# Patient Record
Sex: Female | Born: 1935 | Race: White | Hispanic: No | Smoking: Never smoker
Health system: Southern US, Community
[De-identification: ages and names within clinical notes are randomized; demographics above are authoritative.]

## PROBLEM LIST (undated history)

## (undated) DIAGNOSIS — R413 Other amnesia: Secondary | ICD-10-CM

## (undated) DIAGNOSIS — F419 Anxiety disorder, unspecified: Secondary | ICD-10-CM

## (undated) DIAGNOSIS — H409 Unspecified glaucoma: Secondary | ICD-10-CM

## (undated) DIAGNOSIS — E860 Dehydration: Secondary | ICD-10-CM

## (undated) DIAGNOSIS — I10 Essential (primary) hypertension: Secondary | ICD-10-CM

## (undated) DIAGNOSIS — R569 Unspecified convulsions: Secondary | ICD-10-CM

## (undated) DIAGNOSIS — I2510 Atherosclerotic heart disease of native coronary artery without angina pectoris: Secondary | ICD-10-CM

## (undated) DIAGNOSIS — H3530 Unspecified macular degeneration: Secondary | ICD-10-CM

---

## 2004-10-09 ENCOUNTER — Emergency Department: Payer: Self-pay | Admitting: Unknown Physician Specialty

## 2004-10-11 ENCOUNTER — Inpatient Hospital Stay: Payer: Self-pay | Admitting: Anesthesiology

## 2005-09-13 ENCOUNTER — Ambulatory Visit: Payer: Self-pay | Admitting: Internal Medicine

## 2005-09-21 ENCOUNTER — Other Ambulatory Visit: Payer: Self-pay

## 2005-09-21 ENCOUNTER — Emergency Department: Payer: Self-pay | Admitting: Emergency Medicine

## 2005-09-22 ENCOUNTER — Other Ambulatory Visit: Payer: Self-pay

## 2005-09-22 ENCOUNTER — Inpatient Hospital Stay: Payer: Self-pay

## 2005-09-23 ENCOUNTER — Other Ambulatory Visit: Payer: Self-pay

## 2005-09-29 ENCOUNTER — Other Ambulatory Visit: Payer: Self-pay

## 2005-09-29 ENCOUNTER — Inpatient Hospital Stay: Payer: Self-pay

## 2005-09-30 ENCOUNTER — Other Ambulatory Visit: Payer: Self-pay

## 2005-10-01 ENCOUNTER — Other Ambulatory Visit: Payer: Self-pay

## 2013-12-23 ENCOUNTER — Emergency Department: Payer: Self-pay | Admitting: Internal Medicine

## 2014-03-18 ENCOUNTER — Inpatient Hospital Stay: Payer: Self-pay | Admitting: Internal Medicine

## 2014-03-18 LAB — COMPREHENSIVE METABOLIC PANEL
Albumin: 3.6 g/dL (ref 3.4–5.0)
Alkaline Phosphatase: 84 U/L
Anion Gap: 6 — ABNORMAL LOW (ref 7–16)
BILIRUBIN TOTAL: 0.7 mg/dL (ref 0.2–1.0)
BUN: 28 mg/dL — ABNORMAL HIGH (ref 7–18)
CHLORIDE: 111 mmol/L — AB (ref 98–107)
Calcium, Total: 9 mg/dL (ref 8.5–10.1)
Co2: 25 mmol/L (ref 21–32)
Creatinine: 2.04 mg/dL — ABNORMAL HIGH (ref 0.60–1.30)
EGFR (African American): 24 — ABNORMAL LOW
EGFR (Non-African Amer.): 21 — ABNORMAL LOW
Glucose: 102 mg/dL — ABNORMAL HIGH (ref 65–99)
Osmolality: 289 (ref 275–301)
Potassium: 4.1 mmol/L (ref 3.5–5.1)
SGOT(AST): 30 U/L (ref 15–37)
SGPT (ALT): 16 U/L (ref 12–78)
Sodium: 142 mmol/L (ref 136–145)
Total Protein: 7.1 g/dL (ref 6.4–8.2)

## 2014-03-18 LAB — CBC
HCT: 43.4 % (ref 35.0–47.0)
HGB: 14 g/dL (ref 12.0–16.0)
MCH: 30.4 pg (ref 26.0–34.0)
MCHC: 32.1 g/dL (ref 32.0–36.0)
MCV: 95 fL (ref 80–100)
Platelet: 123 10*3/uL — ABNORMAL LOW (ref 150–440)
RBC: 4.59 10*6/uL (ref 3.80–5.20)
RDW: 14.2 % (ref 11.5–14.5)
WBC: 9.4 10*3/uL (ref 3.6–11.0)

## 2014-03-18 LAB — URINALYSIS, COMPLETE
BACTERIA: NONE SEEN
BLOOD: NEGATIVE
Bilirubin,UR: NEGATIVE
GLUCOSE, UR: NEGATIVE mg/dL (ref 0–75)
Ketone: NEGATIVE
LEUKOCYTE ESTERASE: NEGATIVE
Nitrite: NEGATIVE
Ph: 5 (ref 4.5–8.0)
Protein: NEGATIVE
RBC,UR: <1 /HPF (ref 0–5)
SPECIFIC GRAVITY: 1.015 (ref 1.003–1.030)
Squamous Epithelial: NONE SEEN

## 2014-03-18 LAB — CK-MB: CK-MB: 1.7 ng/mL (ref 0.5–3.6)

## 2014-03-18 LAB — TROPONIN I: TROPONIN-I: 0.02 ng/mL

## 2014-03-19 LAB — BASIC METABOLIC PANEL
Anion Gap: 10 (ref 7–16)
BUN: 25 mg/dL — AB (ref 7–18)
CALCIUM: 8.9 mg/dL (ref 8.5–10.1)
CHLORIDE: 109 mmol/L — AB (ref 98–107)
CREATININE: 1.64 mg/dL — AB (ref 0.60–1.30)
Co2: 23 mmol/L (ref 21–32)
EGFR (Non-African Amer.): 27 — ABNORMAL LOW
GFR CALC AF AMER: 31 — AB
Glucose: 162 mg/dL — ABNORMAL HIGH (ref 65–99)
Osmolality: 291 (ref 275–301)
Potassium: 4.8 mmol/L (ref 3.5–5.1)
Sodium: 142 mmol/L (ref 136–145)

## 2014-03-19 LAB — CBC WITH DIFFERENTIAL/PLATELET
BASOS PCT: 0.1 %
Basophil #: 0 10*3/uL (ref 0.0–0.1)
EOS PCT: 0.2 %
Eosinophil #: 0 10*3/uL (ref 0.0–0.7)
HCT: 42.2 % (ref 35.0–47.0)
HGB: 14 g/dL (ref 12.0–16.0)
LYMPHS ABS: 0.9 10*3/uL — AB (ref 1.0–3.6)
LYMPHS PCT: 6.9 %
MCH: 31.1 pg (ref 26.0–34.0)
MCHC: 33.2 g/dL (ref 32.0–36.0)
MCV: 94 fL (ref 80–100)
MONO ABS: 1 x10 3/mm — AB (ref 0.2–0.9)
Monocyte %: 7.9 %
NEUTROS PCT: 84.9 %
Neutrophil #: 11 10*3/uL — ABNORMAL HIGH (ref 1.4–6.5)
Platelet: 134 10*3/uL — ABNORMAL LOW (ref 150–440)
RBC: 4.5 10*6/uL (ref 3.80–5.20)
RDW: 14 % (ref 11.5–14.5)
WBC: 13 10*3/uL — ABNORMAL HIGH (ref 3.6–11.0)

## 2014-03-19 LAB — TROPONIN I
TROPONIN-I: 0.04 ng/mL
TROPONIN-I: 0.19 ng/mL — AB
Troponin-I: 0.14 ng/mL — ABNORMAL HIGH

## 2014-03-19 LAB — CK-MB
CK-MB: 4.6 ng/mL — ABNORMAL HIGH (ref 0.5–3.6)
CK-MB: 6.6 ng/mL — AB (ref 0.5–3.6)

## 2014-03-20 ENCOUNTER — Ambulatory Visit: Payer: Self-pay | Admitting: Neurology

## 2014-03-20 LAB — CBC WITH DIFFERENTIAL/PLATELET
BASOS PCT: 0.4 %
Basophil #: 0 10*3/uL (ref 0.0–0.1)
EOS PCT: 6.3 %
Eosinophil #: 0.4 10*3/uL (ref 0.0–0.7)
HCT: 38.6 % (ref 35.0–47.0)
HGB: 13 g/dL (ref 12.0–16.0)
LYMPHS ABS: 1 10*3/uL (ref 1.0–3.6)
Lymphocyte %: 16.2 %
MCH: 31.4 pg (ref 26.0–34.0)
MCHC: 33.6 g/dL (ref 32.0–36.0)
MCV: 93 fL (ref 80–100)
MONOS PCT: 16.5 %
Monocyte #: 1 x10 3/mm — ABNORMAL HIGH (ref 0.2–0.9)
Neutrophil #: 3.8 10*3/uL (ref 1.4–6.5)
Neutrophil %: 60.6 %
Platelet: 112 10*3/uL — ABNORMAL LOW (ref 150–440)
RBC: 4.13 10*6/uL (ref 3.80–5.20)
RDW: 14.1 % (ref 11.5–14.5)
WBC: 6.3 10*3/uL (ref 3.6–11.0)

## 2014-03-20 LAB — PROTIME-INR
INR: 1.2
Prothrombin Time: 14.9 secs — ABNORMAL HIGH (ref 11.5–14.7)

## 2014-03-20 LAB — BASIC METABOLIC PANEL
Anion Gap: 6 — ABNORMAL LOW (ref 7–16)
BUN: 16 mg/dL (ref 7–18)
CHLORIDE: 114 mmol/L — AB (ref 98–107)
CREATININE: 1.27 mg/dL (ref 0.60–1.30)
Calcium, Total: 8.5 mg/dL (ref 8.5–10.1)
Co2: 23 mmol/L (ref 21–32)
EGFR (African American): 42 — ABNORMAL LOW
EGFR (Non-African Amer.): 37 — ABNORMAL LOW
GLUCOSE: 96 mg/dL (ref 65–99)
Osmolality: 286 (ref 275–301)
Potassium: 3.7 mmol/L (ref 3.5–5.1)
Sodium: 143 mmol/L (ref 136–145)

## 2014-03-20 LAB — APTT: Activated PTT: 53.4 secs — ABNORMAL HIGH (ref 23.6–35.9)

## 2014-03-21 LAB — CSF CELL CT + PROT + GLU PANEL
CSF Tube #: 3
EOS PCT: 0 %
GLUCOSE, CSF: 57 mg/dL (ref 40–75)
Lymphocytes: 0 %
Monocytes/Macrophages: 0 %
NEUTROS PCT: 0 %
OTHER CELLS: 0 %
Protein, CSF: 42 mg/dL (ref 15–45)
RBC (CSF): 0 /mm3
WBC (CSF): 0 /mm3

## 2014-03-21 LAB — PROTIME-INR
INR: 1.1
PROTHROMBIN TIME: 14.3 s (ref 11.5–14.7)

## 2014-03-21 LAB — APTT: Activated PTT: 30.7 secs (ref 23.6–35.9)

## 2014-03-24 LAB — CSF CULTURE

## 2014-04-05 ENCOUNTER — Observation Stay: Payer: Self-pay | Admitting: Internal Medicine

## 2014-04-05 LAB — APTT: Activated PTT: 29.2 secs (ref 23.6–35.9)

## 2014-04-05 LAB — CK-MB
CK-MB: 1.6 ng/mL (ref 0.5–3.6)
CK-MB: 1.6 ng/mL (ref 0.5–3.6)

## 2014-04-05 LAB — CBC
HCT: 42.2 % (ref 35.0–47.0)
HGB: 14.1 g/dL (ref 12.0–16.0)
MCH: 31.3 pg (ref 26.0–34.0)
MCHC: 33.5 g/dL (ref 32.0–36.0)
MCV: 94 fL (ref 80–100)
Platelet: 171 10*3/uL (ref 150–440)
RBC: 4.51 10*6/uL (ref 3.80–5.20)
RDW: 14.5 % (ref 11.5–14.5)
WBC: 6.5 10*3/uL (ref 3.6–11.0)

## 2014-04-05 LAB — COMPREHENSIVE METABOLIC PANEL
ALBUMIN: 3.6 g/dL (ref 3.4–5.0)
AST: 32 U/L (ref 15–37)
Alkaline Phosphatase: 91 U/L
Anion Gap: 5 — ABNORMAL LOW (ref 7–16)
BUN: 21 mg/dL — AB (ref 7–18)
Bilirubin,Total: 0.7 mg/dL (ref 0.2–1.0)
CALCIUM: 9.4 mg/dL (ref 8.5–10.1)
CHLORIDE: 107 mmol/L (ref 98–107)
Co2: 29 mmol/L (ref 21–32)
Creatinine: 1.57 mg/dL — ABNORMAL HIGH (ref 0.60–1.30)
EGFR (Non-African Amer.): 28 — ABNORMAL LOW
GFR CALC AF AMER: 33 — AB
Glucose: 111 mg/dL — ABNORMAL HIGH (ref 65–99)
Osmolality: 285 (ref 275–301)
Potassium: 4.5 mmol/L (ref 3.5–5.1)
SGPT (ALT): 23 U/L (ref 12–78)
Sodium: 141 mmol/L (ref 136–145)
Total Protein: 7.5 g/dL (ref 6.4–8.2)

## 2014-04-05 LAB — URINALYSIS, COMPLETE
BLOOD: NEGATIVE
Bacteria: NONE SEEN
Bilirubin,UR: NEGATIVE
Glucose,UR: NEGATIVE mg/dL (ref 0–75)
Hyaline Cast: 9
KETONE: NEGATIVE
Leukocyte Esterase: NEGATIVE
Nitrite: NEGATIVE
Ph: 6 (ref 4.5–8.0)
Protein: NEGATIVE
RBC,UR: 1 /HPF (ref 0–5)
SPECIFIC GRAVITY: 1.017 (ref 1.003–1.030)
Squamous Epithelial: <1
WBC UR: 3 /HPF (ref 0–5)

## 2014-04-05 LAB — TROPONIN I
TROPONIN-I: 0.03 ng/mL
TROPONIN-I: 0.04 ng/mL
Troponin-I: 0.03 ng/mL

## 2014-04-05 LAB — PROTIME-INR
INR: 1
PROTHROMBIN TIME: 13.2 s (ref 11.5–14.7)

## 2014-04-06 LAB — CBC WITH DIFFERENTIAL/PLATELET
BASOS ABS: 0 10*3/uL (ref 0.0–0.1)
Basophil %: 0.9 %
Eosinophil #: 0.3 10*3/uL (ref 0.0–0.7)
Eosinophil %: 6 %
HCT: 38.2 % (ref 35.0–47.0)
HGB: 12.8 g/dL (ref 12.0–16.0)
LYMPHS ABS: 1 10*3/uL (ref 1.0–3.6)
LYMPHS PCT: 21.6 %
MCH: 31.5 pg (ref 26.0–34.0)
MCHC: 33.6 g/dL (ref 32.0–36.0)
MCV: 94 fL (ref 80–100)
Monocyte #: 0.8 x10 3/mm (ref 0.2–0.9)
Monocyte %: 17.2 %
Neutrophil #: 2.6 10*3/uL (ref 1.4–6.5)
Neutrophil %: 54.3 %
Platelet: 146 10*3/uL — ABNORMAL LOW (ref 150–440)
RBC: 4.07 10*6/uL (ref 3.80–5.20)
RDW: 14.2 % (ref 11.5–14.5)
WBC: 4.7 10*3/uL (ref 3.6–11.0)

## 2014-04-06 LAB — BASIC METABOLIC PANEL
ANION GAP: 10 (ref 7–16)
BUN: 18 mg/dL (ref 7–18)
CALCIUM: 8.9 mg/dL (ref 8.5–10.1)
CHLORIDE: 110 mmol/L — AB (ref 98–107)
CREATININE: 1.3 mg/dL (ref 0.60–1.30)
Co2: 24 mmol/L (ref 21–32)
EGFR (African American): 41 — ABNORMAL LOW
GFR CALC NON AF AMER: 36 — AB
Glucose: 89 mg/dL (ref 65–99)
OSMOLALITY: 288 (ref 275–301)
POTASSIUM: 4.2 mmol/L (ref 3.5–5.1)
Sodium: 144 mmol/L (ref 136–145)

## 2014-04-06 LAB — MAGNESIUM: MAGNESIUM: 1.8 mg/dL

## 2014-04-25 ENCOUNTER — Ambulatory Visit: Payer: Self-pay | Admitting: Internal Medicine

## 2014-09-26 ENCOUNTER — Observation Stay: Payer: Self-pay | Admitting: Internal Medicine

## 2014-09-26 LAB — COMPREHENSIVE METABOLIC PANEL
ALBUMIN: 3.2 g/dL — AB (ref 3.4–5.0)
Alkaline Phosphatase: 88 U/L
Anion Gap: 7 (ref 7–16)
BUN: 29 mg/dL — AB (ref 7–18)
Bilirubin,Total: 0.6 mg/dL (ref 0.2–1.0)
CALCIUM: 8.5 mg/dL (ref 8.5–10.1)
CHLORIDE: 112 mmol/L — AB (ref 98–107)
CO2: 23 mmol/L (ref 21–32)
Creatinine: 1.36 mg/dL — ABNORMAL HIGH (ref 0.60–1.30)
GFR CALC AF AMER: 47 — AB
GFR CALC NON AF AMER: 39 — AB
GLUCOSE: 121 mg/dL — AB (ref 65–99)
Osmolality: 290 (ref 275–301)
POTASSIUM: 4.5 mmol/L (ref 3.5–5.1)
SGOT(AST): 35 U/L (ref 15–37)
SGPT (ALT): 19 U/L
Sodium: 142 mmol/L (ref 136–145)
Total Protein: 6.8 g/dL (ref 6.4–8.2)

## 2014-09-26 LAB — PROTIME-INR
INR: 1
Prothrombin Time: 13.4 secs (ref 11.5–14.7)

## 2014-09-26 LAB — CBC
HCT: 39.3 % (ref 35.0–47.0)
HGB: 13 g/dL (ref 12.0–16.0)
MCH: 31.3 pg (ref 26.0–34.0)
MCHC: 33.1 g/dL (ref 32.0–36.0)
MCV: 95 fL (ref 80–100)
Platelet: 145 10*3/uL — ABNORMAL LOW (ref 150–440)
RBC: 4.16 10*6/uL (ref 3.80–5.20)
RDW: 13.8 % (ref 11.5–14.5)
WBC: 9.3 10*3/uL (ref 3.6–11.0)

## 2014-09-26 LAB — URINALYSIS, COMPLETE
BILIRUBIN, UR: NEGATIVE
Blood: NEGATIVE
Glucose,UR: NEGATIVE mg/dL (ref 0–75)
KETONE: NEGATIVE
Nitrite: NEGATIVE
PH: 5 (ref 4.5–8.0)
Protein: NEGATIVE
Specific Gravity: 1.015 (ref 1.003–1.030)
WBC UR: 13 /HPF (ref 0–5)

## 2014-09-26 LAB — CK TOTAL AND CKMB (NOT AT ARMC)
CK, Total: 48 U/L (ref 26–192)
CK-MB: 1.6 ng/mL (ref 0.5–3.6)

## 2014-09-26 LAB — APTT: Activated PTT: 29.6 secs (ref 23.6–35.9)

## 2014-09-26 LAB — TROPONIN I

## 2014-09-27 LAB — BASIC METABOLIC PANEL
Anion Gap: 4 — ABNORMAL LOW (ref 7–16)
BUN: 18 mg/dL (ref 7–18)
CALCIUM: 8.3 mg/dL — AB (ref 8.5–10.1)
CO2: 27 mmol/L (ref 21–32)
CREATININE: 1.28 mg/dL (ref 0.60–1.30)
Chloride: 112 mmol/L — ABNORMAL HIGH (ref 98–107)
EGFR (Non-African Amer.): 41 — ABNORMAL LOW
GFR CALC AF AMER: 50 — AB
GLUCOSE: 98 mg/dL (ref 65–99)
Osmolality: 287 (ref 275–301)
POTASSIUM: 3.7 mmol/L (ref 3.5–5.1)
Sodium: 143 mmol/L (ref 136–145)

## 2014-09-28 LAB — CBC WITH DIFFERENTIAL/PLATELET
BASOS PCT: 0.9 %
Basophil #: 0 10*3/uL (ref 0.0–0.1)
Eosinophil #: 0.3 10*3/uL (ref 0.0–0.7)
Eosinophil %: 7.9 %
HCT: 34.6 % — ABNORMAL LOW (ref 35.0–47.0)
HGB: 11.2 g/dL — ABNORMAL LOW (ref 12.0–16.0)
LYMPHS ABS: 1.3 10*3/uL (ref 1.0–3.6)
Lymphocyte %: 30.3 %
MCH: 31.2 pg (ref 26.0–34.0)
MCHC: 32.4 g/dL (ref 32.0–36.0)
MCV: 96 fL (ref 80–100)
Monocyte #: 0.7 x10 3/mm (ref 0.2–0.9)
Monocyte %: 16 %
Neutrophil #: 1.9 10*3/uL (ref 1.4–6.5)
Neutrophil %: 44.9 %
PLATELETS: 115 10*3/uL — AB (ref 150–440)
RBC: 3.59 10*6/uL — AB (ref 3.80–5.20)
RDW: 14.1 % (ref 11.5–14.5)
WBC: 4.2 10*3/uL (ref 3.6–11.0)

## 2014-09-28 LAB — BASIC METABOLIC PANEL
Anion Gap: 6 — ABNORMAL LOW (ref 7–16)
BUN: 17 mg/dL (ref 7–18)
Calcium, Total: 8.6 mg/dL (ref 8.5–10.1)
Chloride: 115 mmol/L — ABNORMAL HIGH (ref 98–107)
Co2: 24 mmol/L (ref 21–32)
Creatinine: 1.22 mg/dL (ref 0.60–1.30)
EGFR (African American): 53 — ABNORMAL LOW
EGFR (Non-African Amer.): 44 — ABNORMAL LOW
Glucose: 83 mg/dL (ref 65–99)
Osmolality: 289 (ref 275–301)
Potassium: 3.7 mmol/L (ref 3.5–5.1)
SODIUM: 145 mmol/L (ref 136–145)

## 2014-10-01 LAB — CULTURE, BLOOD (SINGLE)

## 2014-11-07 ENCOUNTER — Ambulatory Visit: Payer: Self-pay | Admitting: Family Medicine

## 2014-11-23 ENCOUNTER — Inpatient Hospital Stay: Payer: Self-pay | Admitting: Internal Medicine

## 2014-12-06 ENCOUNTER — Ambulatory Visit
Admit: 2014-12-06 | Disposition: A | Discharge status: Home / Self Care | Payer: Self-pay | Attending: Family Medicine | Admitting: Family Medicine

## 2014-12-21 ENCOUNTER — Emergency Department: Payer: Self-pay | Admitting: Emergency Medicine

## 2015-01-28 NOTE — H&P (Signed)
PATIENT NAME:  [name], [name] MR#:  [id number] DATE OF BIRTH:  03-23-1920  DATE OF ADMISSION:  09/26/2014  REFERRING PHYSICIAN:  Bobetta LimeEryka A. Inocencio HomesGayle, MD  PRIMARY CARE PHYSICIAN:  Jillene BucksDenny C. Arlana Pouchate, MD   CHIEF COMPLAINT:  Almost passed out.   HISTORY OF PRESENT ILLNESS:  This is a 81 year old Caucasian female with a history of chronic atrial fibrillation, hypertension essential, hyperlipidemia unspecified, coronary artery disease status post PCI and stent placement, presenting with near syncope. [facility] is from [facility]. They said she felt weak the majority of the day, uses a walker at baseline; however, today she says she was trying to walk with a walker and felt faint, almost passed out. She was assisted by some staff members to a seated position. She did not pass out. No loss of consciousness. Denies any preceding symptoms including chest pain, palpitations, or shortness of breath. She does attest to having some increased urinary frequency. Denies any dysuria. No further symptomatology. Of note, in the Emergency Department, she was given ceftriaxone and became more confused. Blood pressure dropped transiently; she received IV fluids and has subsequently resolved.   REVIEW OF SYSTEMS: CONSTITUTIONAL:  Positive for fatigue and weakness. Denies fevers or chills.  EYES:  Denies blurred vision, double vision, or eye pain.  EARS, NOSE, AND THROAT:  Denies tinnitus, ear pain, or hearing loss. RESPIRATORY:  Denies cough, wheeze, or shortness of breath.  CARDIOVASCULAR:  Denies chest pain, palpitations, or edema.  GASTROINTESTINAL:  Denies nausea, vomiting, diarrhea, or abdominal pain.  GENITOURINARY:  Positive for increased urinary frequency.  ENDOCRINE:  Denies nocturia or thyroid problems. HEMATOLOGIC AND LYMPHATIC:  Denies easy bruising or bleeding.   SKIN:  Denies rash or lesions.  MUSCULOSKELETAL:  Denies pain in neck, back, shoulder, knees, or hips or arthritic symptoms. NEUROLOGIC:   Denies  paralysis or paresthesias.  PSYCHIATRIC:  Denies anxiety or depressive symptoms.   Otherwise, a full review of systems performed by me is negative.  PAST MEDICAL HISTORY:  Anxiety, depression not otherwise specified, atrial fibrillation chronic, hypertension essential, hyperlipidemia unspecified, coronary artery disease status post PCI and stent placement.   SOCIAL HISTORY:  Resides at [facility]. Uses a walker for ambulation. No alcohol, tobacco, or drug use.   FAMILY HISTORY:  Positive for coronary artery disease.  ALLERGIES:  No known drug allergies.   HOME MEDICATIONS:  Aspirin 81 mg Miles.o. daily, buspirone 10 mg 1/2 tablet Miles.o. b.i.d., metoprolol 25 mg daily, Detrol 4 mg every other day, hydralazine 10 mg Miles.o. 4 times daily.   PHYSICAL EXAMINATION: VITAL SIGNS:  Temperature 97.3, heart rate 89, respirations 18, blood pressure 170/74, saturating 99% on room air.   GENERAL:  Weak-appearing Caucasian female, currently in no acute distress.  HEAD:  Normocephalic, atraumatic.  EYES:  Pupils are equal, round, and reactive to light intact. No extraocular muscles are intact. No scleral icterus.  MOUTH:  Moist mucosal membranes. Dentition is intact. No abscess noted.  EARS, NOSE, AND THROAT:  Clear without exudates. No external lesions.  NECK:  Supple. No thyromegaly. No nodules. No JVD.  PULMONARY:  Clear to auscultation bilaterally without wheezes, rales, or rhonchi. No use of accessory muscles. Good respiratory effort. CHEST:  Nontender to palpation.  CARDIOVASCULAR:  S1, S2. Irregular rate, irregular rhythm. No murmurs, rubs, or gallops. No edema. Pedal pulses are 2+ bilaterally.  GASTROINTESTINAL:  Soft, nontender, nondistended. No masses. Positive bowel sounds. No hepatosplenomegaly.  MUSCULOSKELETAL:  No swelling, clubbing, or edema. Range of motion is full  in all extremities.  NEUROLOGIC: Cranial nerves II through XII are intact. No gross neurologic deficit. Sensation is  intact. Reflexes are intact.  SKIN:  No ulcerations, lesions, rashes, or cyanosis. Skin is warm and dry. Turgor is intact.  PSYCHIATRIC:  Mood and affect are within normal limits. She is awake, alert, and oriented to person, place, and time, although her responses are somewhat slowed. She had difficulty answering what year it was, though eventually did get it correct. Insight and judgment appear to be intact at this time.   LABORATORY DATA:  Sodium is 142, potassium 4.5, chloride 112, bicarbonate 53, BUN 29, creatinine 1.36, glucose 121. LFTs:  Albumin of 3.2, otherwise within normal limits. Troponin is less than 0.02. WBC is 9.3, hemoglobin 13, platelets of 145,000. INR is 1. Urinalysis:  WBCs 13, RBCs 2, leukocyte esterase 2+, epithelial cells 1.   She had an echocardiogram performed back in July of this year:  EF of 60 to 65%. There is some diastolic dysfunction, however. The remainder of laboratory data:  Chest x-ray performed, which reveals underlying emphysema. There is a nodular opacity of the left apex measuring 3.1 x 2.3 cm. CT of the head performed reveals no acute intracranial process.   ASSESSMENT AND PLAN:  This is a 81 year old Caucasian female with history of chronic atrial fibrillation, essential hypertension, unspecified hyperlipidemia, presenting with a near syncopal episode.   1.  Near syncope. We will place on telemetry. Trend cardiac enzymes x 3. We will provide gentle IV fluid hydration.  2.  Urinary tract infection. Given ceftriaxone in the Emergency Department and became confused. Blood pressure dropped. We will follow culture data and continue antibiotic coverage with Cipro. Ceftriaxone has been listed as one of her allergies.  3.  Hypertension. Continue with Lopressor/hydralazine.  4.  Anxiety and depression not otherwise specified. Continue buspirone.  5.  Lung mass noted on chest x-ray. No prior mention of any lung mass on prior chest x-rays. It is measuring 3.1 x 2.3 cm on  chest x-ray in the left apex. We will get a noncontrast CT to further assess this.  6.  Venous thromboembolism prophylaxis with heparin subcutaneously.   CODE STATUS:  The patient is a full code.  TIME SPENT:  45 minutes.    ____________________________ Cletis Athens. Lister Brizzi, MD dkh:nb D: 09/27/2014 00:28:14 ET T: 09/27/2014 01:18:12 ET JOB#: [id number]  cc: Cletis Athens. Trevyon Swor, MD, <Dictator> Tora Prunty Synetta Shadow MD ELECTRONICALLY SIGNED 09/27/2014 3:14

## 2015-01-28 NOTE — Consult Note (Signed)
PATIENT NAME:  [name], [name] MR#:  [id number] DATE OF BIRTH:  June 16, 1920  CARDIOLOGY CONSULTATION   DATE OF CONSULTATION:  03/19/2014  CONSULTING PHYSICIAN:  Marcina MillardAlexander Kandon Hosking, MD  PRIMARY CARE PHYSICIAN: Jillene BucksDenny C. Arlana Pouchate, MD   CHIEF COMPLAINT: Altered mental status.   HISTORY OF PRESENT ILLNESS: The patient is a 81 year old female, currently a resident at an independent living facility, admitted with altered mental status. The patient has known coronary artery disease, status post prior coronary stent. The patient apparently was found naked by her couch, was experiencing altered mental status with un-interpretable speech. The patient was [facility] Room, where a head CT was performed which did not reveal any acute intracranial process. The patient was able to move all extremities, but was unable to converse or follow any commands. Admission labs were notable for elevated BUN and creatinine of 28 and 2.04, respectively. Troponin was borderline elevated to 0.19.   PAST MEDICAL HISTORY:  1. Coronary artery disease, status post coronary stent.  2. Atrial fibrillation.  3. Hypertension.  4. Hyperlipidemia.  5. Anxiety disorder.   MEDICATIONS:  1. Aspirin 81 mg daily. 2. Toprol-XL. 3. Buspirone 5 mg b.i.d. 4. Detrol LA 4 mg every other day.   SOCIAL HISTORY: The patient is currently a resident in assisted living.   FAMILY HISTORY: Mother is status post myocardial infarction.   REVIEW OF SYSTEMS: Not obtainable.   PHYSICAL EXAMINATION:  VITAL SIGNS: Blood pressure 151/81, pulse is 112, respirations 22, temperature 98.7, pulse oximetry 97%.  HEENT: Pupils were equally reactive to light and accommodation.  NECK: Supple without thyromegaly.  LUNGS: Clear.  CARDIOVASCULAR: Normal JVP. Normal PMI. Regular rate and rhythm. Normal S1, S2. No appreciable gallop, murmur or rub.  ABDOMEN: Soft and nontender. Pulses were intact bilaterally.  MUSCULOSKELETAL: Normal muscle tone.   NEUROLOGICAL: The patient was in obvious delirium, able to move all extremities, unable to converse or answer any questions.   IMPRESSION: A 81 year old female who presents with altered mental status, unable to converse, without specific complaints. EKG is nondiagnostic. Troponin is borderline elevated. I doubt this is due to acute coronary syndrome in light of the patient's presentation. The patient does have chronic kidney disease, but otherwise, labs were unremarkable.   RECOMMENDATIONS:  1. Agree with overall current therapy.  2. Would defer full-dose anticoagulation.  3. Consider 2-D echocardiogram, though the patient would likely not be cooperative at this point. 4. Await neurological evaluation.  5. Further recommendations pending the patient's initial clinical course.   ____________________________ Marcina MillardAlexander Khaliah Barnick, MD ap:lb D: 03/19/2014 09:45:13 ET T: 03/19/2014 10:08:14 ET JOB#: [id number]  cc: Marcina MillardAlexander Aala Ransom, MD, <Dictator> Marcina MillardALEXANDER Kannan Proia MD ELECTRONICALLY SIGNED 03/22/2014 8:33

## 2015-01-28 NOTE — H&P (Signed)
PATIENT NAME:  [name], [name] MR#:  [id number] DATE OF BIRTH:  [birth year]  DATE OF ADMISSION:  04/05/2014  REFERRING PHYSICIAN: Dr. Cyril Loosen.   PRIMARY CARE PHYSICIAN: Dr. Arlana Pouch.   CHIEF COMPLAINT: Confusion.   HISTORY OF PRESENT ILLNESS: The patient is a pleasant 81 year old female who is actually here on the 12th of June, got discharged after a few days after presenting with altered mental status, and she underwent extensive work-up including an MRI and LP. She did get some antibiotics, possibly for meningitis initially, but they were discontinued. [facility].  She is there, an independent living section. She was brought in after possible confusion episode, but at this point, she appears to be awake, alert, oriented x 3. Has no particular complaints. She has a CT of the head, which was negative for acute stroke. Hospitalist was contacted for further evaluation and management. The patient thinks that she might have passed out, but it is not clear.   PAST MEDICAL HISTORY: History of chronic atrial fibrillation, hypertension, hyperlipidemia, coronary artery disease, status post stent placement, and anxiety disorder.   ALLERGIES: NO KNOWN DRUG ALLERGIES.   OUTPATIENT MEDICATIONS: Toprol 25 mg b.i.d., Detrol LA 4 mg every other day, buspirone 10 mg 1/2 tablet 2 times a day, and aspirin 81 mg daily.   SOCIAL HISTORY: No tobacco, alcohol or drug use. Lives at [facility].   PAST SURGICAL HISTORY: Ovarian cyst removed and cardiac catheterization.   FAMILY HISTORY: Per chart, myocardial infarction in mom at old age. Brother had motor vehicle accident.  REVIEW OF SYSTEMS:  CONSTITUTIONAL: Denies fever and fatigue, maybe she is a little weak. She states no weight changes.  EYES: No blurry vision or double vision.  EAR, NOSE, THROAT: No tinnitus or hearing loss.  RESPIRATORY: No cough, wheezing, or shortness of breath.  CARDIOVASCULAR: Has a history of CAD and atrial fibrillation.   Denies palpitations or swelling in the legs.  GASTROINTESTINAL: Denies nausea, vomiting, bloody stools, diarrhea, constipation or  abdominal pain.  GENITOURINARY: Denies dysuria or hematuria. HEMATOLOGIC AND LYMPHATIC: Denies anemia or easy bruising.  SKIN: Denies rashes.  MUSCULOSKELETAL: Denies arthritis. NEUROLOGIC: Denies focal weakness or numbness.  PSYCHIATRIC: Has a little bit of anxiety.   PHYSICAL EXAMINATION: VITAL SIGNS: Temperature on arrival is 98.2, pulse rate 78, respiratory rate 17, blood pressure 141/72, oxygen saturation 98% on room air.  GENERAL: The patient is a well-developed female lying in bed elderly in no obvious distress. Talking in full sentences.  HEENT: Normocephalic, atraumatic. Pupils are equal and reactive. Anicteric sclerae. Moist mucous membranes. Poor dentition.  NECK: Supple. No thyroid tenderness.  CARDIOVASCULAR: S1, S2. The patient has a systolic murmur in aortic region. No significant gallops.  LUNGS: Clear to auscultation without wheezing, rhonchi, or rales.  ABDOMEN: Soft, nontender, positive bowel sounds in all four quadrants.  EXTREMITIES: No pitting edema.  NEUROLOGIC: Cranial nerves appear to be intact II-XII. Strength is 5/5 in all extremities. Sensation is intact to light touch.  PSYCHIATRIC: Awake, alert and oriented x 3. Pleasant and cooperative.  SKIN: No obvious rashes or lesions.  LABORATORY AND IMAGING:  CT, as above, of the chest:  No acute cardiopulmonary abnormalities.  BUN 21, creatinine 1.5 Sodium 141, potassium 4.5. LFTs within normal limits. Troponin negative. White count 6.5, hemoglobin 14.1, platelets 171, INR 1. UA does not suggest infection.   EKG: Normal sinus rhythm, rate of 77. Some nonspecific ST changes and no acute ST elevations or depressions.  ASSESSMENT AND PLAN: A pleasant 81 year old female with history of what appears to be  paroxysmal atrial fibrillation, hypertension, hyperlipidemia, recent admission to the  hospital after coming in for altered mental status in the middle of the month, got discharged with home health on June 16 after encephalopathy resolved. Comes back in. At this point, she does not have a fever or significant leukocytosis in the x-ray of the chest suggesting a pneumonia or urinary tract infection symptoms. She has no significant systemic inflammatory response syndrome criteria. Furthermore, EKG does not appear to be in atrial fibrillation and she is awake, alert, oriented x 3. At this point, we would admit her for observation. It is possible that the patient is syncopized. Would monitor for significant arrhythmias as she did have atrial fibrillation with rapid ventricular response in the last admission. Currently, she is in sinus. Would also obtain an echocardiogram and an ultrasound carotid for further work-up. We will obtain a physical therapy consult. She has a mild acute kidney injury and put her on some gentle fluids for now. Would also cycle the troponins and CK-MB. I would continue her outpatient medications and start her on a heparin for deep vein thrombosis prophylaxis.   TOTAL TIME SPENT: About 40 minutes.     ____________________________ Krystal EatonShayiq Ahmadzia, MD sa:ts D: 04/05/2014 15:56:58 ET T: 04/05/2014 18:01:59 ET JOB#: [id number]  cc: Krystal EatonShayiq Ahmadzia, MD, <Dictator> Jillene Bucksenny C. Arlana Pouchate, MD Krystal EatonSHAYIQ AHMADZIA MD ELECTRONICALLY SIGNED 04/15/2014 17:13

## 2015-01-28 NOTE — Discharge Summary (Signed)
PATIENT NAME:  [name], [name] MR#:  [id number] DATE OF BIRTH:  [birth year]  DATE OF ADMISSION:  04/05/2014 DATE OF DISCHARGE:  04/06/2014  DISCHARGE DIAGNOSES:  1. Transient confusion/presyncope, likely secondary to uncontrolled hypertension, now resolved and back to baseline.  2. Uncontrolled hypertension. Adjusted current blood pressure medicine and started new one and  better blood pressure control.   SECONDARY DIAGNOSES:  1. Chronic atrial fibrillation.  2. Hypertension.  3. Hyperlipidemia.  4. Scoliotic disease.  5. Anxiety disorder.   CONSULTATION: Physical therapy.   PROCEDURES AND RADIOLOGY: A 2D echocardiogram on July 1st showed LVEF of 60% to 65%. Impaired  relaxation of LV diastolic filling.   Chest x-ray on June 30th showed no acute cardiopulmonary disease.  CT scan of the head without contrast on June 30th showed no acute intracranial pathology.   Bilateral carotid Doppler on June 30th showed No significant stenosis with estimated stenosis of less than 50%.   MAJOR LABORATORY PANEL: UA on admission was negative.   HISTORY AND SHORT HOSPITAL COURSE: The patient is a 81 year old female with the above-mentioned medical problem and was admitted for confusion which was transient in nature. Please see Dr. Lupe CarneyAhmadzia's dictated history and physical for further details. The patient was thought to have confusion/presyncope secondary to uncontrolled hypertension. This was verified with patient's caregiver at her facility, [facility] and she was started on some new blood pressure medicine to get her blood pressure under better control. She was feeling much better and close to her baseline mental status and was discharged [facility] July 1st in stable condition.  On the date of discharge the vital signs are as follows: Temperature 98.8, heart rate 71 per minute, respirations 20 per minute, blood pressure 146/67 mmHg, and she was saturating 95% on room air.    PERTINENT PHYSICAL EXAMINATION ON THE DATE OF DISCHARGE:  CARDIOVASCULAR: S1, S2 normal. No murmurs, rubs, gallops. LUNGS:  Clear to auscultation bilaterally. No wheezing, rales, rhonchi, crepitation.  ABDOMEN: Soft, benign.  NEUROLOGIC: Nonfocal examination.   All other physical examination remained at baseline.   DISCHARGE MEDICATIONS:  1. Detrol LA 4 mg  Miles.o. every other day.  2. Aspirin 81 mg Miles.o. daily.  3. Buspirone 10 mg Miles.o. half tablet b.i.d.  4. Metoprolol 25 mg Miles.o. daily.  5. Hydralazine 10 mg Miles.o. 4 times a day.   DISCHARGE DIET: Low sodium, low fat, low cholesterol.   DISCHARGE ACTIVITY: As tolerated.   DISCHARGE INSTRUCTIONS AND FOLLOWUP: Patient was instructed to follow up with her primary care physician, Dr. Dewaine Oatsenny Tate, in 1-2 weeks. She was instructed to call her primary care physician before coming to the Emergency Department as she might be able to get some help just to get her blood pressure under better control by her primary care physician.   TOTAL TIME DISCHARGING THIS PATIENT: 45 minutes.     ____________________________ Ellamae SiaVipul S. Sherryll BurgerShah, MD vss:dd D: 04/08/2014 19:35:47 ET T: 04/09/2014 05:10:55 ET JOB#: [id number]  cc: Brittannie Tawney S. Sherryll BurgerShah, MD, <Dictator> Jillene Bucksenny C. Arlana Pouchate, MD Ellamae SiaVIPUL S [facility] MD ELECTRONICALLY SIGNED 04/10/2014 18:39

## 2015-01-28 NOTE — Discharge Summary (Signed)
PATIENT NAME:  [name], Ople P MR#:  [id number] DATE OF BIRTH:  [birth year]  DATE OF ADMISSION:  03/18/2014 DATE OF DISCHARGE:  03/22/2014  ADMISSION DIAGNOSIS: Encephalopathy.   DISCHARGE DIAGNOSES: 1.  Encephalopathy of unclear etiology. Does not appear to be infectious in nature.  2.  Elevated troponin from demand ischemia.  3.  Hypertension. 4.  Atrial fibrillation/rapid ventricular response. Now in normal sinus rhythm.   CONSULTATIONS:  1.  Neurology.  2.  Cardiology.   DIAGNOSTIC DATA: Laboratories at discharge: CSF culture: No organism seen.   White blood cells 6.3, hemoglobin 13, hematocrit 39, platelets 112,000.   MRI of the brain showed no acute ischemia.   Troponins were negative.   HOSPITAL COURSE: A 81 year old female who presented with altered mental status, confusion, and encephalopathy. For further details, please refer to the H and P.  1.  Mental status changes with encephalopathy, unclear etiology. The patient was empirically treated with broad-spectrum antibiotics for possible meningitis. She underwent a LP which essentially was normal. No evidence of any kind of infection. Neurology was consulted. They did not think the patient had meningitis as well. She had a MRI which did not show any evidence of acute ischemia; however, all of her symptoms actually subsided within 24 hours of admission. She is back to her normal baseline. All antibiotics have been stopped. 2.  Elevated troponin from demand ischemia, not acute coronary syndrome.  3.  Hypertension. The patient's blood pressure has improved with increasing metoprolol.  4.  History of A-fib with RVR. The patient had some RVR. With increased metoprolol her heart rate improved and she is actually in normal sinus rhythm.   DISCHARGE MEDICATIONS:  1.  Detrol 4 mg daily. 2.  Aspirin 81 mg daily.  3.  Buspirone 10 mg half tablet b.i.d.  4.  Metoprolol 25 mg 3 tablets daily.   DISCHARGE DIET: Low sodium. Discharge  supplement: Ensure daily.   DISCHARGE ACTIVITY: As tolerated.   DISCHARGE REFERRAL: The patient will be [facility] DISCHARGE FOLLOWUP: The patient will need follow-up in 2 weeks with Dr. Dewaine Oatsenny Tate.   The patient is medically stable for discharge.  ____________________________ Sital P. Juliene PinaMody, MD spm:sb D: 03/22/2014 09:04:00 ET T: 03/22/2014 09:16:41 ET JOB#: [id number]  cc: Sital P. Juliene PinaMody, MD, <Dictator> Jillene Bucksenny C. Arlana Pouchate, MD Janyth ContesSITAL P MODY MD ELECTRONICALLY SIGNED 03/22/2014 9:55

## 2015-01-28 NOTE — H&P (Signed)
PATIENT NAME:  [name], [name] MR#:  [id number] DATE OF BIRTH:  1920/09/30  DATE OF ADMISSION:  03/18/2014  PRIMARY CARE PHYSICIAN:  Dr. Dewaine Oats.   REFERRING PHYSICIAN:  Dr. Governor Rooks.   CHIEF COMPLAINT:  Altered mental status.   HISTORY OF PRESENT ILLNESS:  [name] is a 81 year old female who lives in independent living facility, was doing well until this afternoon.  The patient had her lunch and spoke with her daughter.  In the evening the patient was found by her couch naked, altered mental status, illogical speech.  Concerning this, the patient is brought to the Emergency Department.  Work-up in the Emergency Department, no obvious signs of any infection are noted.  CT head without contrast, no acute intracranial abnormality.  The patient is moving all four extremities.  Unable to follow any commands.  The patient has a normal white blood cell count.  Urinalysis and chest x-ray are unremarkable.  The patient is not on any kind of sedative medications on the medication list.   PAST MEDICAL HISTORY: 1.  Atrial fibrillation.  2.  Hypertension.  3.  Hyperlipidemia.  4.  Coronary artery disease status post stent placement.  5.  Anxiety disorder.   ALLERGIES:  No known drug allergies.   HOME MEDICATIONS: 1.  Toprol-XL once daily.  2.  Detrol LA 4 mg every other day.  3.  Buspirone 10 mg 1/2 tablet 2 times a day.  4.  Aspirin 81 mg once a day.   SOCIAL HISTORY:  Per previous records, no history of smoking, drinking alcohol or using illicit drugs.   PAST SURGICAL HISTORY:  Ovarian cyst in 1940.  Cardiac catheterization.   FAMILY HISTORY:  Mother with a myocardial infarction at an old age.  Brother had a motor vehicle accident.   REVIEW OF SYSTEMS:  Could not be obtained from the patient as the patient is confused.   PHYSICAL EXAMINATION: GENERAL:  This is a thin built female lying down in the bed confused, quite restless in the bed, moving all 4 extremities.  VITAL SIGNS:   Temperature 98.2, pulse 108, blood pressure 195/97, respiratory rate of 19, oxygen saturation is 96% on room air.  HEENT:  Head normocephalic, atraumatic.  Eyes, no scleral icterus.  Conjunctivae normal.  Pupils equal and react to light.  Mucous membranes could not examine.  Could not examine the oropharynx.  NECK:  Supple.  No lymphadenopathy.  No JVD.  No carotid bruit.  CHEST:  Has no focal tenderness.  LUNGS:  Bilateral clear to auscultation.  HEART:  S1, S2 regular.  No murmurs are heard.  ABDOMEN:  Bowel sounds plus.  Soft, nontender, nondistended.  No hepatosplenomegaly.  EXTREMITIES:  No pedal edema.  Pulses 2+.  SKIN:  No rash or lesions.  MUSCULOSKELETAL:  The patient is moving all four extremities.  NEUROLOGIC:  The patient is not oriented to place, person, and time.  No apparent cranial nerve abnormalities.  The patient is moving all four extremities.  Could not examine the sensory.   LABORATORY DATA:  CK-MB 4.6, troponin 0.04.  CK-MB of 1.7.  CMP:  BUN 28, creatinine of 2.04, the rest of all the values are within normal limits.  CBC is completely within normal limits.  Troponin 0.02.  UA negative for nitrites and leukocyte esterase.   EKG, 12-lead:  Normal sinus rhythm with no ST-T wave abnormalities.   ASSESSMENT AND PLAN:  [name] is a 81 year old female who comes with acute onset  of altered mental status.  1.  Altered mental status.  The differential diagnosis, possible cerebrovascular accident versus infection.  Keep the patient on Rocephin and vancomycin.  Also add acyclovir.  We will obtain lumbar puncture.  We will obtain MRA of the brain in the morning.   2.  Hypertension, poorly controlled, most likely secondary to agitation.  3.  Atrial fibrillation:  Again, most likely secondary to agitation.  Continue to follow up, holding the Toprol-XL as the patient is unable to take, however we will keep the patient on Lopressor 5 mg IV every six hours as needed.  4.  Keep the  patient on deep vein thrombosis prophylaxis with Lovenox.   TIME SPENT:  50 minutes.   ____________________________ Jeanette GriffinsPadmaja Joell Usman, MD pv:ea D: 03/19/2014 01:28:54 ET T: 03/19/2014 02:11:25 ET JOB#: [id number]  cc: Jeanette GriffinsPadmaja Deyja Sochacki, MD, <Dictator> Jeanette Bucksenny C. Arlana Pouchate, MD Jeanette LavPADMAJA Deatrice Spanbauer MD ELECTRONICALLY SIGNED 03/23/2014 7:20

## 2015-01-28 NOTE — Consult Note (Signed)
PATIENT NAME:  [name], [name] MR#:  [id number] DATE OF BIRTH:  08-Oct-1919  DATE OF CONSULTATION:  03/20/2014  CONSULTING PHYSICIAN:  Pauletta BrownsYuriy Sharmane Dame, MD  REASON FOR CONSULTATION:  Altered mental status.   REPORT OF CONSULTATION: This patient lives in an independent living facility. The patient was seen to have her lunch normally, I spoke with her daughter, and in the evening was found by her couch, undressed, with altered mental status, with difficulty understanding the patient. The patient was brought to the Emergency Department. Workup included rule out stroke versus infectious process, at which point she was started on broad-spectrum antibiotics. CAT scan of the head did not show acute intracranial abnormality. Neurology consulted for altered mental status, which appears to be improving.   PAST MEDICAL HISTORY: Atrial fibrillation, hypertension, hyperlipidemia, coronary artery disease, anxiety disorder.   HOME MEDICATIONS: Include Toprol, Detrol LA, buspirone, aspirin 81 mg daily.   ALLERGIES: No known drug allergies.   SOCIAL HISTORY: No history of EtOH use. No history of smoking. No illicit drug use.   PAST SURGICAL HISTORY: Ovarian cyst that was removed in the 1940s.   FAMILY HISTORY: Mother died with a history of myocardial infarction and old age.   REVIEW OF SYSTEMS: Currently denies any visual changes. Denies any headaches. Denies any shortness of breath. Denies any palpitations. Denies any chest pain. No cough. No hemoptysis. No diarrhea. No constipation. No weakness on one side of the body compared to the other. No ataxia.   DATA:  Laboratory findings have been reviewed. Imaging: CAT scan the head did not show any acute intracranial abnormalities. The patient is status post MRI of the brain, it also did not show any acute intracranial abnormalities.   VITAL SIGNS: Include a temperature of 98.6, pulse 65, respirations 18, blood pressure 172/82, pulse ox 98%.   PHYSICAL  EXAMINATION: The patient is awake to her name, tells me this is a hospital and the name of the facility. Could not tell me the date or time. Speech appears to be fluent. No signs of dysarthria or aphasia. Extraocular movement intact. Facial sensation intact. Facial motor intact. Tongue is midline. Uvula elevates symmetrically. Shoulder shrug intact. Motor strength appears to be 4/5 in the upper extremities and 3/5 in bilateral lower extremities. Reflexes severely diminished throughout. Coordination: Finger-to-nose slow but intact. Gait not assessed.   IMPRESSION: A 81 year old female with history of atrial fibrillation, hypertension, hyperlipidemia, coronary artery disease, anxiety disorder, presenting with altered mental status, being found down, status post hydration, antibiotics, imaging, which was negative for any acute ischemia. Currently it appears that patient's mental status is close to baseline. She is able to converse fully, able to tell me the name the hospital. I agree with discontinuation of vancomycin. Consider tapering down Rocephin unless source as been found. Continue hydration. Discharge planning from neurological standpoint. No further imaging from neurological standpoint.   Thank you, it was a pleasure seeing this patient.    ____________________________ Pauletta BrownsYuriy Kaytlyn Din, MD yz:mr D: 03/20/2014 15:48:26 ET T: 03/20/2014 18:40:42 ET JOB#: [id number]  cc: Pauletta BrownsYuriy Rolla Servidio, MD, <Dictator> Pauletta BrownsYURIY Chavela Justiniano MD ELECTRONICALLY SIGNED 03/24/2014 17:21

## 2015-02-01 NOTE — Discharge Summary (Signed)
PATIENT NAME:  [name], Aleda P MR#:  [id number] DATE OF BIRTH:  12-02-1919  DATE OF ADMISSION:  09/26/2014 DATE OF DISCHARGE:  09/28/2014  ADMITTING DIAGNOSIS: Near syncope.   DISCHARGE DIAGNOSES:  1.  Near syncope, likely due to urinary tract infection and dehydration. No other events noted on telemetry.  2.  Urinary tract infection.  3.  Hypertension.  4.  Anxiety and depression.  5.  Abnormal chest x-ray with a lung mass. CT shows a 7 mm nodule in the right upper lobe.  6.  Generalized weakness recommended physical therapy.   PERTINENT LABORATORY AND EVALUATION:  Admitting glucose 121, BUN 29, creatinine 1.36, sodium 142, potassium 4.5, chloride 112, CO2 23, calcium 8.5. LFTs are normal except albumin of 3.2. Troponin less than 0.02. WBC 9.3, hemoglobin 13, platelet count 145,000. Blood cultures, no growth. Urinalysis: Bacteria 1+ and leukocytes 2+.   CONSULTANTS: None.   HOSPITAL COURSE: Please refer to H and P done by the admitting physician. The patient is a 81 year old white female with history of chronic atrial fibrillation, hypertension, hyperlipidemia, unspecified coronary artery disease, who had a near syncope who was brought to the ED. She was noted to have some UTI and appeared dehydrated. The patient was given fluids and antibiotics. She was monitored on tele without any arrhythmias. The patient is doing much better and is stable for discharge at this point.   DISCHARGE MEDICATIONS: Detrol LA 4 mg every other day, aspirin 81 mg 1 tab p.o. daily, bupropion 10 mg 1/2 tab p.o. b.i.d., hydralazine 10 one tab p.o. 4 times a day, metoprolol tartrate 12.5 mg b.i.d., naproxen 2 caps b.i.d., antioxidants 1 tab p.o. b.i.d., latanoprost ophthalmic 0.005% one drop to each affected eye, ciprofloxin 500 mg 1 tab p.o. q. 24 hours times seven days.   DIET: Low sodium.   ACTIVITY: As tolerated.   FOLLOW-UP:  Follow with primary M.D. in 1 to 2 weeks.   TIME SPENT: 35 minutes.    ____________________________ Lacie ScottsShreyang H. Allena KatzPatel, MD shp:at D: 09/28/2014 15:23:00 ET T: 09/28/2014 16:16:43 ET JOB#: [id number]  cc: Deema Juncaj H. Allena KatzPatel, MD, <Dictator> Charise CarwinSHREYANG H Tawan Corkern MD ELECTRONICALLY SIGNED 10/09/2014 12:18

## 2015-02-05 NOTE — Consult Note (Signed)
Reason for Consult: Admit Date: 23-Nov-2014  Chief Complaint: stroke  Reason for Consult: CVA   History of Present Illness: History of Present Illness:   [name] is a 81 yo woman with PMH notable for HTN, Afib (not on anticoagulation), and CAD s/p PCI in 2008 who presented to [facility] ED after being found down at her SNF with aphasia and right-sided weakness and neglect. The patient is unable to provide history, so this is obtained via phone from her niece and legal guardian Alton Revere.  baseline, the patient has some mild cognitive difficulties and ambulates with a walker. She needs help taking care of her finances and meals but is otherwise independent for ADLs. She had an episode in June of 2015 with transient confusion and aphasia that was attributed to hypertensive encephalopathy; brain MRI at that time was negative for infarct. Since that episode, she has needed more assistance with self-care and transitioned to the SNF floor at her assisted living facility. has a history of ischemic cardiomyopathy and underwent PCI at [facility] around 2008 (unknown vessels). She was told she should have a CABG but the patient declined this. She has a history of atrial fibrillation but is not anticoagulated (due to her advanced age, per her niece).   ROS:  Review of Systems   Unable to obtain ROS due to altered mental status.  Past Medical/Surgical Hx:  anxiety:   overactive bladder:   Atrial Fibrillation:   Hypertension:   Hypercholesterolemia:   cataract surgery:   cardiac stents:   Home Medications: Medication Instructions Last Modified Date/Time  hydrALAZINE 10 mg oral tablet 1 tab(s) orally 4 times a day 17-Feb-16 04:00  busPIRone 10 mg oral tablet 0.5 each orally 2 times a day 17-Feb-16 04:00  Detrol LA 4 mg oral capsule, extended release 1  orally every other day 17-Feb-16 04:00  Aspir 81 81 mg oral tablet 1 tab(s) orally once a day 17-Feb-16 04:00  Combigan 0.2%-0.5% ophthalmic solution 1  drop(s) to right eye every 12 hours 17-Feb-16 04:00  Lumigan 0.01% ophthalmic solution 1 drop(s) to right eye once a day (in the evening) 17-Feb-16 04:00  Metoprolol Tartrate 25 mg oral tablet 0.5 tab(s) orally 2 times a day, 12.$RemoveB'5mg'rIFSrrto$  bid 17-Feb-16 04:00  naproxen sodium sodium 220 mg oral capsule 2 cap(s) orally 2 times a day (with meals) 17-Feb-16 04:00  PreserVision Antioxidant Multiple Vitamins and Minerals oral capsule 1 tab(s) orally 2 times a day 17-Feb-16 04:00   Allergies:  ceftriaxone: Hypotension, Alt Ment Status  Social/Family History: Living Arrangements: assisted living  Social History: Lives in SNF, previously at assisted living facility. Does not smoke, drink EtOH, or use illicit drugs.  Family History: No FH of stroke.   Vital Signs: **Vital Signs.:   17-Feb-16 07:24  Vital Signs Type Admission  Temperature Temperature (F) 97.9  Celsius 36.6  Pulse Pulse 96  Respirations Respirations 16  Systolic BP Systolic BP 947  Diastolic BP (mmHg) Diastolic BP (mmHg) 87  Mean BP 115  Pulse Ox % Pulse Ox % 95  Pulse Ox Activity Level  At rest  Oxygen Delivery Room Air/ 21 %   EXAM: Well-developed, well-nourished, in NAD. No conjunctival injection or scleral edema. Oropharynx clear. No carotid bruits auscultated. Normal S1, S2 and irregularly irregular cardiac rhythm on exam. Lungs clear to auscultation bilaterally. Abdomen soft and nontender. Peripheral pulses palpated. No clubbing, cyanosis, or edema in extremities.  MENTAL STATUS: Somnolent, arousable to voice, noncommunicative. Occasional unintelligible murmuring. Does not answer questions or  follow simple commands, even with mimicry. She appears to neglect her right side. CRANIAL NERVES: Reduced blink-to-threat in the right homonymous hemifield. PERRL. Left gaze preference, but able to track just across midline to the right. Facial muscles grossly full and symmetric.  MOTOR: Tone is slightly increased in the right arm and leg.  Antigravity strength is present in all four limbs, with reduced movement in the right arm and leg. She maintains the left arm and leg against gravity without drift, and the right arm and leg have some downward drift (difficult to tell contribution from hemineglect) REFLEXES: 2+ in biceps, triceps, patella, and achilles bilaterally. Extensor plantar responses bilaterally. SENSORY: Intact to pain throughout. She appears to grimace equally to pain applied to the right arm and leg compared to the left.  COORDINATION: No ataxia or dysmetria on purposeful movements, though does not comply with coordination testing GAIT: Not assessed  NIH Stroke Scale Score: 17 1a. Level of conciousness: 1 1b. Orientation: 2 1c. Commands: 2 2. Gaze: 1 3. Visual Fields: 2 4. Facial Palsy: 0 5a. Motor Left Arm: 0 5b. Motor Right Arm: 1 6a. Motor Left Leg: 0 6b. Motor Right Leg: 1 7. Ataxia: 0 8. Sensory: 0 9. Language: 3 10. Dysarthria: 2 11. Extinction: 2.  Lab Results:  Hepatic:  17-Feb-16 05:12   Bilirubin, Total 0.7  Alkaline Phosphatase 94  SGPT (ALT) 19  SGOT (AST) 27  Total Protein, Serum 7.4  Albumin, Serum 3.6  Routine Chem:  17-Feb-16 03:34   Result Comment METC/CARDIAC PANEL/TROP - SPECIMEN HEMOLYZED-C/ IDA POWEL $RemoveB'@0500'wXwRFgdm$   - 11-23-14 FOR RECOLLECT.  Result(s) reported on 23 Nov 2014 at 04:25AM.    05:12   Glucose, Serum  101  BUN  20  Creatinine (comp)  1.36  Sodium, Serum 142  Potassium, Serum 4.2  Chloride, Serum  109  CO2, Serum 26  Calcium (Total), Serum 9.6  Osmolality (calc) 286  eGFR (African American)  47  eGFR (Non-African American)  39 (eGFR values <48mL/min/1.73 m2 may be an indication of chronic kidney disease (CKD). Calculated eGFR, using the MRDR Study equation, is useful in  patients with stable renal function. The eGFR calculation will not be reliable in acutely ill patients when serum creatinine is changing rapidly. It is not useful in patients on dialysis. The  eGFR calculation may not be applicable to patients at the low and high extremes of body sizes, pregnant women, and vegetarians.)  Anion Gap 7  Cardiac:  17-Feb-16 05:12   Troponin I < 0.02 (0.00-0.05 0.05 ng/mL or less: NEGATIVE  Repeat testing in 3-6 hrs  if clinically indicated. >0.05 ng/mL: POTENTIAL  MYOCARDIAL INJURY. Repeat  testing in 3-6 hrs if  clinically indicated. NOTE: An increase or decrease  of 30% or more on serial  testing suggests a  clinically important change)  CK, Total 63  CPK-MB, Serum 2.2 (Result(s) reported on 23 Nov 2014 at 05:53AM.)  Routine UA:  17-Feb-16 04:19   Color (UA) Straw  Clarity (UA) Clear  Glucose (UA) Negative  Bilirubin (UA) Negative  Ketones (UA) Negative  Specific Gravity (UA) 1.010  Blood (UA) Negative  pH (UA) 7.0  Protein (UA) Negative  Nitrite (UA) Negative  Leukocyte Esterase (UA) Negative (Result(s) reported on 23 Nov 2014 at 06:26AM.)  RBC (UA) <1 /HPF  WBC (UA) <1 /HPF  Bacteria (UA) NONE SEEN  Epithelial Cells (UA) NONE SEEN  Mucous (UA) PRESENT (Result(s) reported on 23 Nov 2014 at 06:26AM.)  Routine Coag:  17-Feb-16 03:34  Prothrombin 14.2 (11.4-15.0 NOTE: New Reference Range  11/04/14)  INR 1.1 (INR reference interval applies to patients on anticoagulant therapy. A single INR therapeutic range for coumarins is not optimal for all indications; however, the suggested range for most indications is 2.0 - 3.0. Exceptions to the INR Reference Range may include: Prosthetic heart valves, acute myocardial infarction, prevention of myocardial infarction, and combinations of aspirin and anticoagulant. The need for a higher or lower target INR must be assessed individually. Reference: The Pharmacology and Management of the Vitamin K  antagonists: the seventh ACCP Conference on Antithrombotic and Thrombolytic Therapy. TDVVO.1607 Sept:126 (3suppl): [id number]. A HCT value >55% may artifactually increase the PT.  In one  study,  the increase was an average of 25%. Reference:  "Effect on Routine and Special Coagulation Testing Values of Citrate Anticoagulant Adjustment in Patients with High HCT Values." American Journal of Clinical Pathology 2006;126:400-405.)  Routine Hem:  17-Feb-16 03:34   WBC (CBC) 6.6  RBC (CBC) 4.50  Hemoglobin (CBC) 13.6  Hematocrit (CBC) 41.5  Platelet Count (CBC) 152  MCV 92  MCH 30.3  MCHC 32.8  RDW 14.3  Neutrophil % 69.4  Lymphocyte % 13.7  Monocyte % 12.5  Eosinophil % 3.8  Basophil % 0.6  Neutrophil # 4.6  Lymphocyte #  0.9  Monocyte # 0.8  Eosinophil # 0.3  Basophil # 0.0 (Result(s) reported on 23 Nov 2014 at 04:25AM.)   Radiology Results: CT:    17-Feb-16 03:52, CT Head Without Contrast  CT Head Without Contrast   REASON FOR EXAM:    right hemineglect, altered mental status  COMMENTS:       PROCEDURE: CT  - CT HEAD WITHOUT CONTRAST  - Nov 23 2014  3:52AM     CLINICAL DATA:  Right hemineglect.    EXAM:  CT HEAD WITHOUT CONTRAST    TECHNIQUE:  Contiguous axial images were obtained from the base of the skull  through the vertex without intravenous contrast.    COMPARISON:  09/26/2014  FINDINGS:  There is a hyperdense left middle cerebral artery. There is no other  evidence of acute infarction. There is no intracranial hemorrhage.  There is no extra-axial fluid collection. There is no mass or mass  effect. There is moderately severe atrophy and chronic microvascular  ischemic disease. Overall no interval change is evident from  09/26/2014. No bony abnormalities are evident.     IMPRESSION:  Hyperdense left MCA, raising the question of a left MCA territory  infarction. No other evidence of an acute infarction.    Severe atrophy and chronic microvascular ischemic disease.    Electronically Signed    By: Andreas Newport M.D.    On: 11/23/2014 03:59         Verified By: Andreas Newport, M.D.,    Impression/Recommendations: Recommendations:   [name] is a 81 yo woman with history of atrial fibrillation not on anticoagulation, CAD, and prior episode of transient confusion/aphasia who presents with right hemineglect and aphasia concerning for acute ischemic stroke. Initial evaluation with head CT showed no territorial infarction but some hyperdensity in the left MCA concerning for thrombus. Hyperdensity is also present in several other vessels, including the basilar artery, which raises the possibility of atherosclerotic disease rather than thrombus as the cause for this signal. Regardless, her neurologic exam is notable for right hemineglect and hemianopia as well as global aphasia, suggestive of continued dysfunction of part of the left MCA arterial distribution and supporting the diagnosis of  ischemic stroke. Her prognosis for functional independence with her current neurologic deficits is guarded, and I suspect she will now be dependent on an increased level of skilled nursing care going forward. IV tPA was not given because she was outside the therapeutic time window based on her time last known well. workup:CT: no large territorial infarctionMRI: pendingstudy: carotid doppler pendingpendinglipid panel: pendingetiology: cardioembolic Brain MRI to evaluate for infarctPT and OT evaluation and treatmentSpeech consult for clincial swallow evaluationASA 81mg  for secondary stroke risk reduction. Would defer full-dose anticoagulation until size of infarct is known via MRIStart subq heparin for DVT prophylaxisPermissive hypertension to SBP 220. Would recommend beginning to lower this to 180 tomorrow if she remains stable you for the opportunity to participate in [name]'s care. I will continue to follow along with you. Please page Neurology with further questions. Mar Daring, MD   Electronic Signatures: Carmin Richmond (MD)  (Signed 17-Feb-16 11:40)  Authored: Consult, History of Present Illness,  Review of Systems, PAST MEDICAL/SURGICAL HISTORY, HOME MEDICATIONS, ALLERGIES, Social/Family History, NURSING VITAL SIGNS, Physical Exam-, LAB RESULTS, RADIOLOGY RESULTS, Recommendations   Last Updated: 17-Feb-16 11:40 by Carmin Richmond (MD)

## 2015-02-05 NOTE — Discharge Summary (Signed)
PATIENT NAME:  [name], [name] MR#:  [id number] DATE OF BIRTH:  1919/12/07  DATE OF ADMISSION:  11/23/2014 DATE OF DISCHARGE:  11/25/2014  ADMITTING DIAGNOSES:   1.  Slurred speech.  2.  Decrease in responsiveness. 3.  Difficulty with moving her left upper and lower extremity.   DISCHARGE DIAGNOSES:  1.  Slurred speech. 2.  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  3.  Chronic atrial fibrillation, not a good candidate for anticoagulation due to fall risk.  4.  Hypertension.  5.  Glaucoma.  6.  Previous history of cataract surgery.  7.  Status post cardiac stents.   CONSULTANTS: Neurology, Dr. Raynelle Charyobert Hendry.   PERTINENT LABS AND EVALUATIONS: Glucose 101, BUN 20, creatinine 1.36, sodium 142, potassium 4.2, chloride 109, CO2 is 26, calcium 9.6.  LFTs were normal. Troponin less than 0.02.  TSH 0.695.  Urinalysis was negative.  MRI of the brain showed no CVA, carotid ultrasound showed bilateral plaques.   HOSPITAL COURSE: Please refer to H and Miles done by the admitting physician. The patient is a 81 year old white female with previous history of having syncope with similar symptoms like this in the past who was brought into the hospital with complaint of left-sided weakness, decrease in responsiveness, some speech changes.  Initially there was concern for a CVA.  She had a CT scan of the head which suggested a possible stroke. The patient underwent an MRI, which was negative.  Neurology saw the patient.  Her symptoms improved significantly. There was a concern that she might have a seizure with Todd's paralysis and was recommended to start on Keppra. The patient is doing much better and is stable for discharge.   DISCHARGE MEDICATIONS: Detrol LA 4 mg daily, aspirin 81 mg 1 tab Miles.o. daily, Buspirone 5 mg 1 tab Miles.o. b.i.d.,  hydralazine 10 at 1 tablet 4 times a day, metoprolol tartrate 12.5 b.i.d., naproxen sodium 2 tablets 2 times a day, PreserVision 1 tablet Miles.o. b.i.d., Combigan 1 drop to right eye every 12 hours, Lumigan 1 drop to right eye at bedtime, Colace 100 mg 1 tab Miles.o. b.i.d. as needed, Keppra 500 mg 1 tab Miles.o. b.i.d.   DISCHARGE DIET: Low-sodium, low-fat, low-cholesterol, pureed, thin liquids. Monitor for signs and symptoms of aspiration.   ACTIVITY: As tolerated. PT evaluation and treatment.   FOLLOWUP: Follow at the skilled nursing facility doctor in 1 week or primary doctor.     TIME SPENT: 35 minutes.    ____________________________ Lacie ScottsShreyang H. Allena KatzPatel, MD shp:DT D: 11/25/2014 11:38:54 ET T: 11/25/2014 12:08:05 ET JOB#: [id number]  cc: Texie Tupou H. Allena KatzPatel, MD, <Dictator> Charise CarwinSHREYANG H Ainsleigh Kakos MD ELECTRONICALLY SIGNED 11/25/2014 15:58

## 2015-02-05 NOTE — H&P (Signed)
PATIENT NAME:  [name], [name] MR#:  [id number] DATE OF BIRTH:  May 08, 1920  DATE OF ADMISSION:  11/23/2014  REFERRING PHYSICIAN: Enedina Finner. Manson Passey, MD   PRIMARY CARE PHYSICIAN: Jillene Bucks. Arlana Pouch, MD  ADMISSION DIAGNOSIS: Left middle cerebral artery stroke.   HISTORY OF PRESENT ILLNESS: This is a 81 year old Caucasian female who presents to the Emergency Department from her assisted living facility where she was found on the floor after an apparent fall. The patient had garbled speech and obvious weakness on her right side. The patient was seen normal much more than 2 hours prior to arrival. In the Emergency Department, she was found to have right hemineglect as well as a hyperdensity in her left parietal region on head CT that was indicative of ischemic stroke. Due to these findings, the Emergency Department called for admission.   REVIEW OF SYSTEMS: The patient's speech is garbled and unintelligible. She cannot contribute to her review of systems  Aside from a clear forehead nod when asked if anything hurts along with my directing her attention to her right leg, to which she answered in the affirmative.   PAST MEDICAL HISTORY: As gleaned from prior documentation, hypertension, atrial fibrillation, glaucoma.   PAST SURGICAL HISTORY: Cataract surgery as well as cardiac stents.   SOCIAL HISTORY: The patient is a resident of [address] of [place]. There is no record of tobacco, alcohol, or drug abuse.   FAMILY HISTORY: Unavailable at this time, as the patient cannot clearly answer my questions.   MEDICATIONS:  1.  Aspirin 81 mg 1 tablet p.o. daily.  2.  Buspirone 10 mg 0.5 mg 1/2 tablet b.i.d.  3.  Combigan 0.2%/0.5% ophthalmic solution 1 drop to the right eye every 12 hours.  4.  Detrol LA 4 mg extended release 1 capsule p.o. daily.  5.  Hydralazine 10 mg 1 tablet p.o. 4 times a day.  6.  Lumigan 0.01% ophthalmic solution 1 drop to the right eye every evening.  7.  Metoprolol tartrate 25 mg  1/2 tablet p.o. b.i.d.  8.  Naproxen sodium 220 mg 2 capsules p.o. b.i.d. with meals.  9.  PreserVision antioxidant vitamins 1 tablet p.o. b.i.d.   ALLERGIES: CEFTRIAXONE.   PERTINENT LABORATORY RESULTS AND RADIOGRAPHIC FINDINGS: Serum glucose is 101, BUN 20, creatinine is 1.36, serum sodium 142, potassium is 4.2, chloride is 109, bicarbonate 26, calcium 9.6. Serum albumin is 3.6, alkaline phosphatase 94, AST 27, ALT is 19. Troponin is negative. White blood cell count is 6.6, hemoglobin 13.6, hematocrit 41.5, MCV is 92, and platelet count is 152,000. INR is 1.1. Urinalysis is negative for infection. X-ray of the chest shows probable congestive heart failure, as there is some interstitial edema as well as a slight perihilar opacity on the left, which may represent alveolar edema. X-ray of the right elbow is negative for fracture or dislocation. X-ray of the right hip is also negative for fracture or dislocation. There is incidentally a calcified uterine fibroid noted.    PHYSICAL EXAMINATION:  VITAL SIGNS: Temperature is 98.1 orally, pulse 90, respirations 18, blood pressure 188/91, pulse oximetry is 98% on room air.  GENERAL: The patient is awake and very agitated. She does not appear in distress, but she cannot verbalize if she is specifically hurting anywhere unless I prompt her.  HEENT: Normocephalic, atraumatic. Pupils equal, round, and reactive to light and accommodation. Extraocular movements are intact. Mucous membranes are moist.  NECK: Trachea is midline. No adenopathy. Thyroid is nonpalpable and nontender.  CHEST: Symmetric  and atraumatic.  CARDIOVASCULAR: Normal rate and irregularly irregular rhythm with a normal S1, S2. No rubs, clicks, or murmurs appreciated.  LUNGS: Clear to auscultation bilaterally. Normal effort and excursion.  ABDOMEN: Positive bowel sounds. Soft, nontender, and nondistended.  GENITOURINARY: Deferred.  MUSCULOSKELETAL: The patient has some deep bruising on her  right upper thigh and some abrasions to both knees and her right elbow. The patient is able to move all 4 extremities, although she does not move her right upper extremity nor her right lower extremity in a coordinated fashion until the examiner touches said extremity.   SKIN: No rashes. There are the aforementioned abrasions and bruises.  EXTREMITIES: No clubbing, cyanosis, or edema.  NEUROLOGIC: The patient does not cooperate with neurologic exam, although she is able to grip slightly with the right hand; grip strength is better on the left when prompted. The patient is not following commands with her lower extremities.  PSYCHIATRIC: Difficult to assess, as the patient currently is unable to speak intelligibly.   ASSESSMENT AND PLAN: This is a 81 year old female with a left middle cerebral artery infarct and right hemineglect.  1.  Cerebrovascular accident. Will allow permissive hypertension at this time for systolic blood pressure less than 220. I have ordered an MRI and carotid ultrasounds. Neurology will consult regarding prognosis. The patient is currently n.p.o. until she is cleared by speech therapy.  2.  Hypertension. As above, but I have ordered labetalol 20 mg intravenous as needed for systolic blood pressure more than 220.  3.  Atrial fibrillation. The patient has only been on aspirin for anticoagulation. Presumably this is because she is a fall risk. Cardiology consult and echocardiogram at the discretion of the primary team to evaluate for possible a cardiac thrombus.  4.  Glaucoma. We will continue the patient's eyedrops per her home regimen assuming that she has meaningful hope of recovery we do not want her vision to be a compounding problem.  5.  Deep vein thrombosis prophylaxis with sequential compression devices.  6.  Gastrointestinal prophylaxis. None.   CODE STATUS: The patient is a full code for now.   TIME SPENT ON ADMISSION ORDERS AND PATIENT CARE: Approximately 35 minutes.     ____________________________ Kelton PillarMichael S. Sheryle Hailiamond, MD msd:bm D: 11/23/2014 06:58:58 ET T: 11/23/2014 07:36:01 ET JOB#: [id number]  cc: Kelton PillarMichael S. Sheryle Hailiamond, MD, <Dictator> Kelton PillarMICHAEL S Ladarious Kresse MD ELECTRONICALLY SIGNED 12/01/2014 0:44

## 2015-06-03 ENCOUNTER — Emergency Department
Admission: EM | Admit: 2015-06-03 | Discharge: 2015-06-03 | Disposition: A | Discharge status: Home / Self Care | Payer: Medicare Other | Source: Home / Self Care | Attending: Emergency Medicine | Admitting: Emergency Medicine

## 2015-06-03 ENCOUNTER — Other Ambulatory Visit: Payer: Self-pay

## 2015-06-03 ENCOUNTER — Encounter: Payer: Self-pay | Admitting: Adult Health

## 2015-06-03 ENCOUNTER — Emergency Department: Payer: Medicare Other

## 2015-06-03 DIAGNOSIS — N390 Urinary tract infection, site not specified: Secondary | ICD-10-CM

## 2015-06-03 DIAGNOSIS — E860 Dehydration: Secondary | ICD-10-CM | POA: Diagnosis not present

## 2015-06-03 DIAGNOSIS — R1310 Dysphagia, unspecified: Secondary | ICD-10-CM

## 2015-06-03 DIAGNOSIS — A419 Sepsis, unspecified organism: Secondary | ICD-10-CM | POA: Diagnosis not present

## 2015-06-03 HISTORY — DX: Unspecified macular degeneration: H35.30

## 2015-06-03 HISTORY — DX: Atherosclerotic heart disease of native coronary artery without angina pectoris: I25.10

## 2015-06-03 HISTORY — DX: Other amnesia: R41.3

## 2015-06-03 HISTORY — DX: Anxiety disorder, unspecified: F41.9

## 2015-06-03 HISTORY — DX: Dehydration: E86.0

## 2015-06-03 HISTORY — DX: Unspecified glaucoma: H40.9

## 2015-06-03 HISTORY — DX: Unspecified convulsions: R56.9

## 2015-06-03 LAB — COMPREHENSIVE METABOLIC PANEL
ALK PHOS: 98 U/L (ref 38–126)
ALT: 16 U/L (ref 14–54)
ANION GAP: 8 (ref 5–15)
AST: 26 U/L (ref 15–41)
Albumin: 3.7 g/dL (ref 3.5–5.0)
BILIRUBIN TOTAL: 0.6 mg/dL (ref 0.3–1.2)
BUN: 29 mg/dL — ABNORMAL HIGH (ref 6–20)
CALCIUM: 9.6 mg/dL (ref 8.9–10.3)
CO2: 26 mmol/L (ref 22–32)
Chloride: 110 mmol/L (ref 101–111)
Creatinine, Ser: 1.41 mg/dL — ABNORMAL HIGH (ref 0.44–1.00)
GFR, EST AFRICAN AMERICAN: 36 mL/min — AB (ref >60)
GFR, EST NON AFRICAN AMERICAN: 31 mL/min — AB (ref >60)
Glucose, Bld: 100 mg/dL — ABNORMAL HIGH (ref 65–99)
Potassium: 4.6 mmol/L (ref 3.5–5.1)
Sodium: 144 mmol/L (ref 135–145)
TOTAL PROTEIN: 7.3 g/dL (ref 6.5–8.1)

## 2015-06-03 LAB — URINALYSIS COMPLETE WITH MICROSCOPIC (ARMC ONLY)
BILIRUBIN URINE: NEGATIVE
GLUCOSE, UA: NEGATIVE mg/dL
HGB URINE DIPSTICK: NEGATIVE
KETONES UR: NEGATIVE mg/dL
Nitrite: NEGATIVE
Protein, ur: NEGATIVE mg/dL
Specific Gravity, Urine: 1.005 (ref 1.005–1.030)
pH: 6 (ref 5.0–8.0)

## 2015-06-03 LAB — CBC WITH DIFFERENTIAL/PLATELET
BASOS ABS: 0.1 10*3/uL (ref 0–0.1)
BASOS PCT: 1 %
Eosinophils Absolute: 0.4 10*3/uL (ref 0–0.7)
Eosinophils Relative: 6 %
HEMATOCRIT: 41.9 % (ref 35.0–47.0)
HEMOGLOBIN: 13.6 g/dL (ref 12.0–16.0)
Lymphocytes Relative: 21 %
Lymphs Abs: 1.6 10*3/uL (ref 1.0–3.6)
MCH: 30.2 pg (ref 26.0–34.0)
MCHC: 32.4 g/dL (ref 32.0–36.0)
MCV: 93.1 fL (ref 80.0–100.0)
MONO ABS: 1 10*3/uL — AB (ref 0.2–0.9)
Monocytes Relative: 14 %
NEUTROS ABS: 4.5 10*3/uL (ref 1.4–6.5)
NEUTROS PCT: 58 %
Platelets: 160 10*3/uL (ref 150–440)
RBC: 4.5 MIL/uL (ref 3.80–5.20)
RDW: 14.1 % (ref 11.5–14.5)
WBC: 7.6 10*3/uL (ref 3.6–11.0)

## 2015-06-03 LAB — TROPONIN I: Troponin I: <0.03 ng/mL (ref <0.031)

## 2015-06-03 MED ORDER — CEPHALEXIN 500 MG PO CAPS: 500.0000 mg | ORAL_CAPSULE | Freq: Two times a day (BID) | ORAL | Status: DC

## 2015-06-03 MED ORDER — HYDRALAZINE HCL 10 MG PO TABS
10.0000 mg | ORAL_TABLET | ORAL | Status: AC
  Administered 2015-06-03: 10 mg via ORAL
  Filled 2015-06-03: qty 1

## 2015-06-03 MED ORDER — CEPHALEXIN 500 MG PO CAPS
500.0000 mg | ORAL_CAPSULE | Freq: Once | ORAL | Status: AC
  Administered 2015-06-03: 500 mg via ORAL
  Filled 2015-06-03: qty 1

## 2015-06-03 NOTE — ED Notes (Addendum)
While waiting for ride pt had sudden change of personality-began cursing and yelling and agitation-vitals revealed hypertension and strong odor to urine-Dr. Arlyce Dice informed. Pt taken to CT with RN accompanyment. Spoke with niece regarding personality change. Niece reports that pt does not curse and does not yell.

## 2015-06-03 NOTE — ED Notes (Addendum)
Spoke with RN at [facility] discharge instructions and prescriptions-nurse verbalized understanding

## 2015-06-03 NOTE — Discharge Instructions (Signed)
As we discussed please call the number provided for GI medicine, to arrange a follow-up appointment as soon as possible. Return to the emergency department for any trouble swallowing, or any other symptom personally concerning to her self.  Additionally, we believe that her earlier confusion and anxiousness may be a result of a mild urinary tract infection.  We have started treatment for this and after she got a dose of her regular blood pressure medicine, her blood pressure resolved as well.  Please follow up as soon as possible with her regular doctor and make sure she continues getting her antibiotics as prescribed today.  Return to the emergency department with any new or worsening symptoms that concern you.    Dysphagia Swallowing problems (dysphagia) occur when solids and liquids seem to stick in your throat on the way down to your stomach, or the food takes longer to get to the stomach. Other symptoms include regurgitating food, noises coming from the throat, chest discomfort with swallowing, and a feeling of fullness or the feeling of something being stuck in your throat when swallowing. When blockage in your throat is complete, it may be associated with drooling. CAUSES  Problems with swallowing may occur because of problems with the muscles. The food cannot be propelled in the usual manner into your stomach. You may have ulcers, scar tissue, or inflammation in the tube down which food travels from your mouth to your stomach (esophagus), which blocks food from passing normally into the stomach. Causes of inflammation include:  Acid reflux from your stomach into your esophagus.  Infection.  Radiation treatment for cancer.  Medicines taken without enough fluids to wash them down into your stomach. You may have nerve problems that prevent signals from being sent to the muscles of your esophagus to contract and move your food down to your stomach. Globus pharyngeus is a relatively common  problem in which there is a sense of an obstruction or difficulty in swallowing, without any physical abnormalities of the swallowing passages being found. This problem usually improves over time with reassurance and testing to rule out other causes. DIAGNOSIS Dysphagia can be diagnosed and its cause can be determined by tests in which you swallow a white substance that helps illuminate the inside of your throat (contrast medium) while X-rays are taken. Sometimes a flexible telescope that is inserted down your throat (endoscopy) to look at your esophagus and stomach is used. TREATMENT   If the dysphagia is caused by acid reflux or infection, medicines may be used.  If the dysphagia is caused by problems with your swallowing muscles, swallowing therapy may be used to help you strengthen your swallowing muscles.  If the dysphagia is caused by a blockage or mass, procedures to remove the blockage may be done. HOME CARE INSTRUCTIONS  Try to eat soft food that is easier to swallow and check your weight on a daily basis to be sure that it is not decreasing.  Be sure to drink liquids when sitting upright (not lying down). SEEK MEDICAL CARE IF:  You are losing weight because you are unable to swallow.  You are coughing when you drink liquids (aspiration).  You are coughing up partially digested food. SEEK IMMEDIATE MEDICAL CARE IF:  You are unable to swallow your own saliva .  You are having shortness of breath or a fever, or both.  You have a hoarse voice along with difficulty swallowing. MAKE SURE YOU:  Understand these instructions.  Will watch your condition.  Will get help right away if you are not doing well or get worse. Document Released: 09/20/2000 Document Revised: 02/07/2014 Document Reviewed: 03/12/2013 [facility] Patient Information 2015 [place], [place]. This information is not intended to replace advice given to you by your health care provider. Make sure you discuss any  questions you have with your health care provider.

## 2015-06-03 NOTE — ED Provider Notes (Signed)
[facility] Emergency Department Provider Note  Time seen: 1:31 PM  I have reviewed the triage vital signs and the nursing notes.   HISTORY  Chief Complaint Dysphagia    HPI [name] is a 81 y.o. female with a past medical history of atrial fibrillation, hypertension, seizure disorder, who presents the emergency department with difficulty swallowing. According to EMS reported the [facility] was at [facility], eating lunch when she began having trouble swallowing. The patient states she briefly choked on food, and [facility] called for EMS. EMS states by the time they arrived the patient was sitting in her wheelchair, no acute distress, answering questions appropriately and following commands. Patient arrives to the emergency department with no complaints at this time. Denies any trouble swallowing currently, denies any cough, congestion, difficulty breathing or chest pain.    No past medical history on file.  There are no active problems to display for this patient.   No past surgical history on file.  No current outpatient prescriptions on file.  Allergies Review of patient's allergies indicates not on file.  No family history on file.  Social History Social History  Substance Use Topics  . Smoking status: Not on file  . Smokeless tobacco: Not on file  . Alcohol Use: Not on file    Review of Systems Constitutional: Negative for fever. Cardiovascular: Negative for chest pain. Respiratory: Negative for shortness of breath. Gastrointestinal: Negative for abdominal pain Neurological: Negative for headache 10-point ROS otherwise negative.  ____________________________________________   PHYSICAL EXAM:  Constitutional: Alert.  Well appearing and in no distress. Eyes: Normal exam ENT   Mouth/Throat: Mucous membranes are moist. Cardiovascular: Normal rate, regular rhythm.  Respiratory: Normal respiratory effort without  tachypnea nor retractions. Breath sounds are clear and equal bilaterally. No wheezes/rales/rhonchi. Gastrointestinal: Soft and nontender. No distention.   Musculoskeletal: Nontender with normal range of motion in all extremities Neurologic:  Normal speech and language. No gross focal neurologic deficits. Equal grip strengths bilaterally. 5/5 motor in all extremities. No pronator drift. No facial droop. Skin:  Skin is warm, dry and intact.  Psychiatric: Mood and affect are normal. Speech and behavior are normal.  ____________________________________________   RADIOLOGY  X-ray within normal limits  ____________________________________________    INITIAL IMPRESSION / ASSESSMENT AND PLAN / ED COURSE  Pertinent labs & imaging results that were available during my care of the patient were reviewed by me and considered in my medical decision making (see chart for details).  Patient appears well currently. Presents the emergency department after which she describes as a choking episode. She states she was able to clear her food, and was able to swallow normally again. Denies any symptoms at this time. We will check a chest x-ray, and perform a bedside swallow screen.  X-ray within normal limits, patient able swallow without any difficulty and a bedside swallow screen. I discussed the patient with her power of attorney via phone, who states this has happened several times in the past. I recommended GI follow-up, they are agreeable. We'll discharge the patient home.  ____________________________________________   FINAL CLINICAL IMPRESSION(S) / ED DIAGNOSES  dysphagia   Minna Antis, MD 06/03/15 1421

## 2015-06-03 NOTE — ED Notes (Addendum)
Presents from [facility] [place] with c/o difficulty swallowing food-denies pain, dizziness, numbness, SOB. Alert. -spoke with niece- stated, "she seemed not herself this weekend, she could not sign her name and was having trouble with her memory. It began this weekend. She is very lonely a few of her friends have passed recently."

## 2015-06-03 NOTE — ED Provider Notes (Signed)
-----------------------------------------   4:48 PM on 06/03/2015 -----------------------------------------   Blood pressure 208/116, pulse 74, temperature 97.7 F (36.5 C), temperature source Oral, resp. rate 20, SpO2 96 %.  The patient was ready for discharge after the resolution of her symptoms as per Dr. Awanda Mink note when she had a fairly acute change in personality and started complaining of severe headache.  Her blood pressure which was mildly elevated is now up over 200 systolic and on repeat testing over 100 diastolic.  I sent her to CT scan which was interpreted by the radiologist as nonacute with no evidence of bleed and no evidence of acute large CVA.  We are obtaining a urine specimen and blood work to look for evidence of end organ dysfunction.  ED ECG REPORT I, Christyna Letendre, the attending physician, personally viewed and interpreted this ECG.   Date: 06/03/2015  EKG Time: 16:44  Rate: 77  Rhythm: normal sinus rhythm  Axis: Left axis deviation  Intervals:Normal  ST&T Change: Non-specific ST segment / T-wave changes, but no evidence of acute ischemia.   ----------------------------------------- 5:17 PM on 06/03/2015 -----------------------------------------  I spoke with the patient's power of attorney by phone.  She states that the patient has been having some personality changes recently that they assume is likely related to dementia, but she has acted unpredictably in the past when she had a UTI.  I explained to this or plan of checking blood and urine and I am also going to give her her usual oral dose of hydralazine 10 mg.  Will then reassess.  I explained to the patient's niece that I do not want to bring her into the hospital unnecessary and that she may be upset because of mild delirium in the setting of a UTI, or she may be having hypertensive emergency and is encephalopathic.  I will reassess after we had the opportunity to  treat.  ----------------------------------------- 6:40 PM on 06/03/2015 -----------------------------------------  The patient's labs are reassuring other than a mild to moderate urinary tract infection.  Her blood pressure came down nicely with one dose of her regular hydralazine 10 mg by mouth.  Since she seems to be back at her baseline, we will discharge her as per the original plan but with treatment for a UTI.  Loleta Rose, MD 06/03/15 [id number]

## 2015-06-04 ENCOUNTER — Emergency Department: Payer: Medicare Other

## 2015-06-04 ENCOUNTER — Inpatient Hospital Stay
Admission: EM | Admit: 2015-06-04 | Discharge: 2015-06-06 | DRG: 871 | Payer: Medicare Other | Attending: Internal Medicine | Admitting: Internal Medicine

## 2015-06-04 ENCOUNTER — Encounter: Payer: Self-pay | Admitting: Internal Medicine

## 2015-06-04 DIAGNOSIS — A419 Sepsis, unspecified organism: Secondary | ICD-10-CM | POA: Diagnosis present

## 2015-06-04 DIAGNOSIS — N179 Acute kidney failure, unspecified: Secondary | ICD-10-CM | POA: Diagnosis present

## 2015-06-04 DIAGNOSIS — H409 Unspecified glaucoma: Secondary | ICD-10-CM | POA: Diagnosis present

## 2015-06-04 DIAGNOSIS — R4182 Altered mental status, unspecified: Secondary | ICD-10-CM

## 2015-06-04 DIAGNOSIS — E860 Dehydration: Secondary | ICD-10-CM

## 2015-06-04 DIAGNOSIS — R1310 Dysphagia, unspecified: Secondary | ICD-10-CM | POA: Diagnosis present

## 2015-06-04 DIAGNOSIS — I2510 Atherosclerotic heart disease of native coronary artery without angina pectoris: Secondary | ICD-10-CM | POA: Diagnosis present

## 2015-06-04 DIAGNOSIS — Z7982 Long term (current) use of aspirin: Secondary | ICD-10-CM

## 2015-06-04 DIAGNOSIS — Z66 Do not resuscitate: Secondary | ICD-10-CM | POA: Diagnosis present

## 2015-06-04 DIAGNOSIS — Z79899 Other long term (current) drug therapy: Secondary | ICD-10-CM | POA: Diagnosis not present

## 2015-06-04 DIAGNOSIS — N390 Urinary tract infection, site not specified: Secondary | ICD-10-CM | POA: Diagnosis present

## 2015-06-04 DIAGNOSIS — I10 Essential (primary) hypertension: Secondary | ICD-10-CM

## 2015-06-04 DIAGNOSIS — H3530 Unspecified macular degeneration: Secondary | ICD-10-CM | POA: Diagnosis present

## 2015-06-04 DIAGNOSIS — R197 Diarrhea, unspecified: Secondary | ICD-10-CM | POA: Diagnosis present

## 2015-06-04 DIAGNOSIS — G40909 Epilepsy, unspecified, not intractable, without status epilepticus: Secondary | ICD-10-CM | POA: Diagnosis present

## 2015-06-04 DIAGNOSIS — J189 Pneumonia, unspecified organism: Secondary | ICD-10-CM | POA: Diagnosis present

## 2015-06-04 DIAGNOSIS — I129 Hypertensive chronic kidney disease with stage 1 through stage 4 chronic kidney disease, or unspecified chronic kidney disease: Secondary | ICD-10-CM | POA: Diagnosis present

## 2015-06-04 DIAGNOSIS — N183 Chronic kidney disease, stage 3 (moderate): Secondary | ICD-10-CM | POA: Diagnosis present

## 2015-06-04 DIAGNOSIS — G9340 Encephalopathy, unspecified: Secondary | ICD-10-CM | POA: Diagnosis present

## 2015-06-04 HISTORY — DX: Essential (primary) hypertension: I10

## 2015-06-04 LAB — URINALYSIS COMPLETE WITH MICROSCOPIC (ARMC ONLY)
Bilirubin Urine: NEGATIVE
Glucose, UA: NEGATIVE mg/dL
HGB URINE DIPSTICK: NEGATIVE
KETONES UR: NEGATIVE mg/dL
NITRITE: NEGATIVE
PH: 5 (ref 5.0–8.0)
PROTEIN: NEGATIVE mg/dL
SPECIFIC GRAVITY, URINE: 1.016 (ref 1.005–1.030)

## 2015-06-04 LAB — URINE DRUG SCREEN, QUALITATIVE (ARMC ONLY)
Amphetamines, Ur Screen: NOT DETECTED
BENZODIAZEPINE, UR SCRN: NOT DETECTED
Barbiturates, Ur Screen: NOT DETECTED
CANNABINOID 50 NG, UR ~~LOC~~: NOT DETECTED
Cocaine Metabolite,Ur ~~LOC~~: NOT DETECTED
MDMA (Ecstasy)Ur Screen: NOT DETECTED
Methadone Scn, Ur: NOT DETECTED
OPIATE, UR SCREEN: NOT DETECTED
PHENCYCLIDINE (PCP) UR S: NOT DETECTED
Tricyclic, Ur Screen: NOT DETECTED

## 2015-06-04 LAB — COMPREHENSIVE METABOLIC PANEL
ALT: 19 U/L (ref 14–54)
AST: 32 U/L (ref 15–41)
Albumin: 3.5 g/dL (ref 3.5–5.0)
Alkaline Phosphatase: 100 U/L (ref 38–126)
Anion gap: 8 (ref 5–15)
BUN: 30 mg/dL — AB (ref 6–20)
CHLORIDE: 112 mmol/L — AB (ref 101–111)
CO2: 23 mmol/L (ref 22–32)
CREATININE: 1.51 mg/dL — AB (ref 0.44–1.00)
Calcium: 9.3 mg/dL (ref 8.9–10.3)
GFR calc non Af Amer: 29 mL/min — ABNORMAL LOW (ref >60)
GFR, EST AFRICAN AMERICAN: 33 mL/min — AB (ref >60)
Glucose, Bld: 118 mg/dL — ABNORMAL HIGH (ref 65–99)
Potassium: 3.8 mmol/L (ref 3.5–5.1)
SODIUM: 143 mmol/L (ref 135–145)
Total Bilirubin: 1 mg/dL (ref 0.3–1.2)
Total Protein: 6.7 g/dL (ref 6.5–8.1)

## 2015-06-04 LAB — CBC WITH DIFFERENTIAL/PLATELET
BASOS ABS: 0 10*3/uL (ref 0–0.1)
BASOS PCT: 0 %
EOS ABS: 0.1 10*3/uL (ref 0–0.7)
Eosinophils Relative: 0 %
HCT: 43.1 % (ref 35.0–47.0)
HEMOGLOBIN: 14.2 g/dL (ref 12.0–16.0)
LYMPHS ABS: 0.2 10*3/uL — AB (ref 1.0–3.6)
Lymphocytes Relative: 1 %
MCH: 30.7 pg (ref 26.0–34.0)
MCHC: 33 g/dL (ref 32.0–36.0)
MCV: 93.1 fL (ref 80.0–100.0)
Monocytes Absolute: 1 10*3/uL — ABNORMAL HIGH (ref 0.2–0.9)
Monocytes Relative: 6 %
NEUTROS PCT: 93 %
Neutro Abs: 14.8 10*3/uL — ABNORMAL HIGH (ref 1.4–6.5)
Platelets: 148 10*3/uL — ABNORMAL LOW (ref 150–440)
RBC: 4.63 MIL/uL (ref 3.80–5.20)
RDW: 14.3 % (ref 11.5–14.5)
WBC: 16.1 10*3/uL — AB (ref 3.6–11.0)

## 2015-06-04 LAB — TROPONIN I

## 2015-06-04 LAB — BLOOD GAS, ARTERIAL
ACID-BASE DEFICIT: 4.1 mmol/L — AB (ref 0.0–2.0)
Bicarbonate: 20 mEq/L — ABNORMAL LOW (ref 21.0–28.0)
FIO2: 0.21
O2 SAT: 92.8 %
PCO2 ART: 33 mmHg (ref 32.0–48.0)
PH ART: 7.39 (ref 7.350–7.450)
PO2 ART: 67 mmHg — AB (ref 83.0–108.0)
Patient temperature: 37

## 2015-06-04 LAB — BRAIN NATRIURETIC PEPTIDE: B Natriuretic Peptide: 281 pg/mL — ABNORMAL HIGH (ref 0.0–100.0)

## 2015-06-04 LAB — LACTIC ACID, PLASMA
Lactic Acid, Venous: 2.1 mmol/L (ref 0.5–2.0)
Lactic Acid, Venous: 3 mmol/L (ref 0.5–2.0)

## 2015-06-04 MED ORDER — BUSPIRONE HCL 5 MG PO TABS
5.0000 mg | ORAL_TABLET | Freq: Two times a day (BID) | ORAL | Status: DC
  Administered 2015-06-05 – 2015-06-06 (×3): 5 mg via ORAL
  Filled 2015-06-04 (×3): qty 1

## 2015-06-04 MED ORDER — DEXTROSE 5 % IV SOLN
2.0000 g | Freq: Once | INTRAVENOUS | Status: AC
  Administered 2015-06-04: 2 g via INTRAVENOUS
  Filled 2015-06-04: qty 2

## 2015-06-04 MED ORDER — DEXTROSE 5 % IV SOLN
2.0000 g | INTRAVENOUS | Status: DC
  Administered 2015-06-05 – 2015-06-06 (×2): 2 g via INTRAVENOUS
  Filled 2015-06-04 (×3): qty 2

## 2015-06-04 MED ORDER — ASPIRIN 81 MG PO CHEW: 81.0000 mg | CHEWABLE_TABLET | Freq: Every day | ORAL | Status: DC

## 2015-06-04 MED ORDER — ACETAMINOPHEN 325 MG PO TABS: 650.0000 mg | ORAL_TABLET | Freq: Four times a day (QID) | ORAL | Status: DC | PRN

## 2015-06-04 MED ORDER — CLINDAMYCIN PHOSPHATE 600 MG/50ML IV SOLN
600.0000 mg | Freq: Once | INTRAVENOUS | Status: AC
  Administered 2015-06-04: 600 mg via INTRAVENOUS

## 2015-06-04 MED ORDER — LATANOPROST 0.005 % OP SOLN
1.0000 [drp] | Freq: Every day | OPHTHALMIC | Status: DC
  Administered 2015-06-04 – 2015-06-05 (×2): 1 [drp] via OPHTHALMIC
  Filled 2015-06-04: qty 2.5

## 2015-06-04 MED ORDER — SODIUM CHLORIDE 0.9 % IV BOLUS (SEPSIS)
1000.0000 mL | Freq: Once | INTRAVENOUS | Status: AC
  Administered 2015-06-04: 1000 mL via INTRAVENOUS

## 2015-06-04 MED ORDER — TIMOLOL MALEATE 0.5 % OP SOLN
1.0000 [drp] | Freq: Two times a day (BID) | OPHTHALMIC | Status: DC
  Administered 2015-06-04 – 2015-06-06 (×5): 1 [drp] via OPHTHALMIC
  Filled 2015-06-04: qty 5

## 2015-06-04 MED ORDER — ONDANSETRON HCL 4 MG PO TABS: 4.0000 mg | ORAL_TABLET | Freq: Four times a day (QID) | ORAL | Status: DC | PRN

## 2015-06-04 MED ORDER — DEXTROSE 5 % IV SOLN
INTRAVENOUS | Status: AC
  Filled 2015-06-04 (×2): qty 10

## 2015-06-04 MED ORDER — BRIMONIDINE TARTRATE 0.2 % OP SOLN
1.0000 [drp] | Freq: Two times a day (BID) | OPHTHALMIC | Status: DC
  Administered 2015-06-04 – 2015-06-06 (×5): 1 [drp] via OPHTHALMIC
  Filled 2015-06-04: qty 5

## 2015-06-04 MED ORDER — ACETAMINOPHEN 650 MG RE SUPP: 650.0000 mg | Freq: Four times a day (QID) | RECTAL | Status: DC | PRN

## 2015-06-04 MED ORDER — HEPARIN SODIUM (PORCINE) 5000 UNIT/ML IJ SOLN
5000.0000 [IU] | Freq: Three times a day (TID) | INTRAMUSCULAR | Status: DC
  Administered 2015-06-04 – 2015-06-05 (×5): 5000 [IU] via SUBCUTANEOUS
  Filled 2015-06-04 (×5): qty 1

## 2015-06-04 MED ORDER — BRIMONIDINE TARTRATE-TIMOLOL 0.2-0.5 % OP SOLN: 1.0000 [drp] | Freq: Two times a day (BID) | OPHTHALMIC | Status: DC

## 2015-06-04 MED ORDER — HALOPERIDOL LACTATE 5 MG/ML IJ SOLN
1.0000 mg | Freq: Four times a day (QID) | INTRAMUSCULAR | Status: DC | PRN
  Administered 2015-06-04: 17:00:00 1 mg via INTRAVENOUS
  Filled 2015-06-04: qty 1

## 2015-06-04 MED ORDER — SODIUM CHLORIDE 0.9 % IV SOLN
INTRAVENOUS | Status: DC
  Administered 2015-06-04: 09:00:00 via INTRAVENOUS

## 2015-06-04 MED ORDER — LORAZEPAM 2 MG/ML IJ SOLN
0.5000 mg | Freq: Four times a day (QID) | INTRAMUSCULAR | Status: DC | PRN
  Administered 2015-06-04: 16:00:00 0.5 mg via INTRAVENOUS
  Filled 2015-06-04: qty 1

## 2015-06-04 MED ORDER — METRONIDAZOLE IN NACL 5-0.79 MG/ML-% IV SOLN
500.0000 mg | Freq: Three times a day (TID) | INTRAVENOUS | Status: DC
  Administered 2015-06-04 – 2015-06-05 (×3): 500 mg via INTRAVENOUS
  Filled 2015-06-04 (×7): qty 100

## 2015-06-04 MED ORDER — LEVETIRACETAM 500 MG PO TABS
500.0000 mg | ORAL_TABLET | Freq: Two times a day (BID) | ORAL | Status: DC
  Administered 2015-06-05 – 2015-06-06 (×3): 500 mg via ORAL
  Filled 2015-06-04 (×4): qty 1

## 2015-06-04 MED ORDER — CLINDAMYCIN PHOSPHATE 600 MG/50ML IV SOLN
600.0000 mg | Freq: Four times a day (QID) | INTRAVENOUS | Status: DC
Start: 2015-06-04 — End: 2015-06-05
  Administered 2015-06-04 – 2015-06-05 (×4): 600 mg via INTRAVENOUS
  Filled 2015-06-04 (×9): qty 50

## 2015-06-04 MED ORDER — ONDANSETRON HCL 4 MG/2ML IJ SOLN: 4.0000 mg | Freq: Four times a day (QID) | INTRAMUSCULAR | Status: DC | PRN

## 2015-06-04 MED ORDER — ASPIRIN EC 81 MG PO TBEC
81.0000 mg | DELAYED_RELEASE_TABLET | Freq: Every day | ORAL | Status: DC
  Administered 2015-06-05 – 2015-06-06 (×2): 81 mg via ORAL
  Filled 2015-06-04 (×2): qty 1

## 2015-06-04 MED ORDER — METRONIDAZOLE IN NACL 5-0.79 MG/ML-% IV SOLN
500.0000 mg | Freq: Once | INTRAVENOUS | Status: AC
  Administered 2015-06-04: 500 mg via INTRAVENOUS

## 2015-06-04 MED ORDER — METRONIDAZOLE IN NACL 5-0.79 MG/ML-% IV SOLN
INTRAVENOUS | Status: AC
  Filled 2015-06-04: qty 100

## 2015-06-04 NOTE — ED Notes (Signed)
Pt from [facility] [place] with ams. Pt moving all extremities but does not follow commands. Per ems staff at [facility] reported pt in assisted living, ambulatory out to desk at 2330 and was speaking and acting appropriately. 2330 was last time pt seen normal.

## 2015-06-04 NOTE — ED Notes (Signed)
fsbs 119

## 2015-06-04 NOTE — Progress Notes (Signed)
Pt pulling telemetry off, disrobing repeatedly, taking off socks, pulling off bed covers, putting legs over side rails- despite all interventions of music, TV, repositioning, pericare, emotional support and verbal cues. Bed alarm activating very frequently. MD notified with ativan prn order obtained for anxiety.

## 2015-06-04 NOTE — Clinical Social Work Note (Signed)
Clinical Social Work Assessment  Patient Details  Name: [name] MRN: [id number] Date of Birth: July 15, 1920  Date of referral:  06/04/15               Reason for consult:   (from [facility] ( ALF))                Permission sought to share information with:    Permission granted to share information::     Name::        Agency::     Relationship::     Contact Information:     Housing/Transportation Living arrangements for the past 2 months:  Assisted Living Facility Source of Information:  Facility Patient Interpreter Needed:  None Criminal Activity/Legal Involvement Pertinent to Current Situation/Hospitalization:    Significant Relationships:  Other Family Members Lives with:  Facility Resident Do you feel safe going back to the place where you live?    Need for family participation in patient care:  Yes (Comment) (patient confused at this time)  Care giving concerns:  None expressed by ALF   Social Worker assessment / plan:  CSW to assess patient.  She continues to display confusion. Call to ALF [facility] [place] [phone] to obtain additional information on patient.  Patient has lived at [facility] a little over a year.  Per RN this confusion is new for patient.  States patient is usually talkative, knows what medications she is taking, wheels herself around in her wheel chair, and uses a walker when in her room.    Per Serina Cowper  RN at [facility], patient's niece is Geryl Rankins [phone] is the HPOA and has been notified that patient was taken to the hospital.  [facility] will have to send an administrator to evaluate patient prior to returning to ALF.  CSW will continue to follow patient for ongoing and discharge needs, complete FL2 and place on chart in anticipation of patient returning to her ALF or a higher level of care if needed.   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  Patient/Family's Understanding of and Emotional Response to Diagnosis, Current Treatment, and Prognosis:  ALF will take patient back after they come to evaluate her level of care.  Emotional Assessment Appearance:  Appears stated age Attitude/Demeanor/Rapport:  Unable to Assess Affect (typically observed):  Unable to Assess Orientation:  Oriented to Self Alcohol / Substance use:    Psych involvement (Current and /or in the community):   none reported  Discharge Needs  Concerns to be addressed:  No discharge needs identified (on going medical work up) Readmission within the last 30 days:  Yes Current discharge risk:  Chronically ill, Dependent with Mobility Barriers to Discharge:  No Barriers Identified   Soundra Pilon, LCSW 06/04/2015, 5:45 PM Sammuel Hines. Theresia Majors, MSW Clinical Social Work Department Emergency Room [phone] 5:50 PM

## 2015-06-04 NOTE — ED Notes (Signed)
Repeat lactic acid drawn at 0600 and sent to lab. Aide no longer at bedside. Head of bed raised to 45 degrees and pt placed on right side. Pt alert to painful stimuli and continues to move all extremities independently. perrl 3mm and brisk. Cardiac monitor and cont pox remain in place. Lung sounds unchanged.

## 2015-06-04 NOTE — Plan of Care (Signed)
Problem: Discharge Progression Outcomes Goal: Discharge plan in place and appropriate Outcome: Progressing 1. Lives at the [facility]. 2. Seen at [facility] yesterday for ED and released with UTI treatment. Pt found unresponsive this a.m and was brought in by EMS. 3. Has macular degeneration with limited vision with eye medications ordered. 4. HIGH risk for fall, aspiration, seizure disorder- High fall precautions with 3 side rails, wrapped IV site to protect, bed alarm, check for safety and toileting needs with safety rounds at least every hour, keep personal items in hands reach, room near nurses station, yellow protective footwear, Keep HOH elevated at least 30 degrees, will keep NPO until alert enough to do swallow study, seizure precautions. Decreased vision care/communication techniques. 5. PMH: HTN, glaucoma, macular degeneration, CAD, Anxiety, seizure disorder controlled by home meds.

## 2015-06-04 NOTE — ED Notes (Signed)
Report to denia, rn.  

## 2015-06-04 NOTE — Progress Notes (Signed)
ANTIBIOTIC CONSULT NOTE - INITIAL  Pharmacy Consult for Ceftriaxone Indication: pneumonia  No Known Allergies  Patient Measurements: Weight: 141 lb 12.1 oz (64.3 kg) Adjusted Body Weight: n/a  Vital Signs: Temp: 98.9 F (37.2 C) (08/28 0600) Temp Source: Axillary (08/28 0600) BP: 128/74 mmHg (08/28 0832) Pulse Rate: 89 (08/28 0832) Intake/Output from previous day:   Intake/Output from this shift:    Labs:  Recent Labs  06/03/15 1645 06/04/15 0342  WBC 7.6 16.1*  HGB 13.6 14.2  PLT 160 148*  CREATININE 1.41* 1.51*   CrCl cannot be calculated (Unknown ideal weight.). No results for input(s): VANCOTROUGH, VANCOPEAK, VANCORANDOM, GENTTROUGH, GENTPEAK, GENTRANDOM, TOBRATROUGH, TOBRAPEAK, TOBRARND, AMIKACINPEAK, AMIKACINTROU, AMIKACIN in the last 72 hours.   Microbiology: No results found for this or any previous visit (from the past 720 hour(s)).  Medical History: Past Medical History  Diagnosis Date  . Glaucoma   . Macular degeneration   . Memory change   . Seizures   . Dehydration   . Coronary artery disease   . Anxiety   . Hypertension     Medications:  Anti-infectives    Start     Dose/Rate Route Frequency Ordered Stop   06/05/15 0900  cefTRIAXone (ROCEPHIN) 2 g in dextrose 5 % 50 mL IVPB     2 g 100 mL/hr over 30 Minutes Intravenous Every 24 hours 06/04/15 0936     06/04/15 1300  metroNIDAZOLE (FLAGYL) IVPB 500 mg     500 mg 100 mL/hr over 60 Minutes Intravenous Every 8 hours 06/04/15 0841     06/04/15 1100  clindamycin (CLEOCIN) IVPB 600 mg     600 mg 100 mL/hr over 30 Minutes Intravenous 4 times per day 06/04/15 0841     06/04/15 0515  metroNIDAZOLE (FLAGYL) IVPB 500 mg     500 mg 100 mL/hr over 60 Minutes Intravenous  Once 06/04/15 0503 06/04/15 0650   06/04/15 0507  metroNIDAZOLE (FLAGYL) 5-0.79 MG/ML-% IVPB    Comments:  BRUMGARD, APRIL: cabinet override      06/04/15 0507 06/04/15 0723   06/04/15 0430  clindamycin (CLEOCIN) IVPB 600 mg     600 mg 100 mL/hr over 30 Minutes Intravenous  Once 06/04/15 0421 06/04/15 0508   06/04/15 0315  cefTRIAXone (ROCEPHIN) 2 g in dextrose 5 % 50 mL IVPB     2 g 100 mL/hr over 30 Minutes Intravenous  Once 06/04/15 1610 06/04/15 0424     Assessment: Patient is a 81yo female admitted with altered mental status. Empirically started on Clindamycin  IV q6h and Ceftriaxone for possible aspiration pneumonia and UTI. Patient also with diarrhea and possible abdominal process so empirically started on Metronidazole  IV q8h as well.  Goal of Therapy:  Resolution of symptoms  Plan:  Follow up culture results Will order Ceftriaxone 2g IV q24h.  Clovia Cuff, PharmD, BCPS 06/04/2015 9:40 AM

## 2015-06-04 NOTE — Plan of Care (Signed)
Problem: Discharge Progression Outcomes Goal: Other Discharge Outcomes/Goals Outcome: Progressing 1. Return to the HOME PLACE if appropriate level of care. Niece,  Geryl Rankins, on demographic information listed as HPOA who lives in [place]. 2. No co's or observed signs of pain. 3. 02 off with pulse ox 100% RA.  VSS. 4. Continued very confused/lethargic/ slightly more responsive. Opens eyes, speak few words, nods head. IV's continued with metronidazole and cleocin IV given. Restless with new IV site started due to IV site in [facility] very positional.  VERY HIGH fall precautions with bed alarm activated multiple times. Pt not alert enough at this time to do swallow test or take po's. Keep NPO for now. Pericare and oral care performed. No cough or stool - specimen containers at bedside. Pt voided incontinent-unable to collect drug screen. Enteric precautions observed. 5. Still NPO due to lethargic/confusion. 6. Continued bed rest with pt moving self in bed and putting feet on side rail.  7. Dr. Clent Ridges called Rudi Heap, listed HPOA in chart with no POA papers accompanying pt.

## 2015-06-04 NOTE — ED Notes (Signed)
Dr. Darnelle Catalan notified of pt's bnp 281 and fluid bolus order. No new order received to change bolus or slow rate. Pt without crackles at this time. Breath sounds unchanged.

## 2015-06-04 NOTE — Progress Notes (Signed)
Continued measures to keep pt safe and calm. Pt continues to pull clothes off, stretching IV line, putting legs on side rails, turning self side ways in bed with no calming effect from ativan. Continued music, imagery on TV, touch/emotional support without benefit. Remains lethargic/confused. TC with Dr. Clent Ridges and discussed with new order for Haldol given.

## 2015-06-04 NOTE — ED Notes (Signed)
Pt returned from ct scan. Pt combative in ct scan, continues to not follow commands.

## 2015-06-04 NOTE — ED Notes (Signed)
Pt to ct scan with rn assist. 

## 2015-06-04 NOTE — ED Notes (Signed)
Staff member at pt's bedside since arrival to help keep pt from removing monitoring devices.

## 2015-06-04 NOTE — ED Notes (Signed)
Pt with large amount of emesis and diarrhea. Mouth suctioned. Pt on side with head of bed at 45 degress. Pt placed on oxygen at 2lpm via [place] for pox of 90% after emesis. md notified. Pt with lactic acid of 2.1 md notified. Order for cleocin and ns bolus received. Pt continues to remain responsive to painful stimuli, will not follow commands, will independently move all extremities at times.

## 2015-06-04 NOTE — H&P (Signed)
[facility] [place] [facility]   PATIENT NAME: [name]    MR#:  [id number]  DATE OF BIRTH:  11-07-1919  DATE OF ADMISSION:  06/04/2015  PRIMARY CARE PHYSICIAN: Jaclyn Shaggy, MD   REQUESTING/REFERRING PHYSICIAN: Dorothea Glassman  CHIEF COMPLAINT:   Chief Complaint  Patient presents with  . Altered Mental Status    HISTORY OF PRESENT ILLNESS:  [name]  is a 81 y.o. female with a known history of hypertension, seizure disorder, nursing home resident, on DO NOT RESUSCITATE status was found to be with altered sensorium with low blood pressure by the nursing home staff, hence sent to the emergency room for further evaluation. In the emergency room patient was evaluated by the ED physician and was found to be confused with borderline low blood pressure of 102/50. Workup revealed elevated white blood cell count of 16.7, BUN/creatinine 30 over 1.51 and chest x-ray with by basilar opacities which may reflect aspiration. Patient was also noted to have diarrhea and vomiting and a CT of the abdomen and pelvis was reported as negative intra-abdominal/pelvis, minimal patchy ill-defined opacities in the lung bases. Off note patient was earlier seen in the ED yesterday for altered sensorium and workup revealed UTI and was sent to nursing home on by mouth Keflex. CT head done at that time revealed no acute intracranial abnormality. After obtaining blood and urine cultures, patient was started on IV antibiotics-Rocephin and clindamycin, was given IV fluids and hospitalist service was consulted for further management. Patient remains confused, responding to her name, moving all 4 limbs and not able to obtain any further history. Patient's blood pressure is currently maintaining at 115/62.  PAST MEDICAL HISTORY:   Past Medical History  Diagnosis Date  . Glaucoma   . Macular degeneration   . Memory change   . Seizures   . Dehydration   . Coronary artery disease   .  Anxiety   . Hypertension     PAST SURGICAL HISTORY:  History reviewed. No pertinent past surgical history.  SOCIAL HISTORY:   Social History  Substance Use Topics  . Smoking status: Never Smoker   . Smokeless tobacco: Not on file  . Alcohol Use: No    FAMILY HISTORY:   Family History  Problem Relation Age of Onset  . Family history unknown: Yes    DRUG ALLERGIES:  No Known Allergies  REVIEW OF SYSTEMS:   Review of Systems  Unable to perform ROS: medical condition    MEDICATIONS AT HOME:   Prior to Admission medications   Medication Sig Start Date End Date Taking? Authorizing Provider  aspirin 81 MG chewable tablet Chew 81 mg by mouth daily.   Yes Historical Provider, MD  bimatoprost (LUMIGAN) 0.01 % SOLN Place 1 drop into the right eye at bedtime.   Yes Historical Provider, MD  brimonidine-timolol (COMBIGAN) 0.2-0.5 % ophthalmic solution Place 1 drop into both eyes every 12 (twelve) hours.   Yes Historical Provider, MD  busPIRone (BUSPAR) 5 MG tablet Take 5 mg by mouth 2 (two) times daily.   Yes Historical Provider, MD  hydrALAZINE (APRESOLINE) 10 MG tablet Take 10 mg by mouth 4 (four) times daily.   Yes Historical Provider, MD  levETIRAcetam (KEPPRA) 500 MG tablet Take 500 mg by mouth 2 (two) times daily.   Yes Historical Provider, MD  metoprolol tartrate (LOPRESSOR) 25 MG tablet Take 12.5 mg by mouth 2 (two) times daily.   Yes Historical Provider, MD  Multiple Vitamins-Minerals (  PRESERVISION AREDS) TABS Take 1 tablet by mouth 2 (two) times daily.   Yes Historical Provider, MD  naproxen sodium (ANAPROX) 220 MG tablet Take 220 mg by mouth 2 (two) times daily with a meal.   Yes Historical Provider, MD  tolterodine (DETROL) 1 MG tablet Take 1 mg by mouth every other day.   Yes Historical Provider, MD  cephALEXin (KEFLEX) 500 MG capsule Take 1 capsule (500 mg total) by mouth 2 (two) times daily. 06/03/15   Loleta Rose, MD      VITAL SIGNS:  Blood pressure 115/62, pulse  79, temperature 98.9 F (37.2 C), temperature source Rectal, resp. rate 14, weight 64.3 kg (141 lb 12.1 oz), SpO2 99 %.  PHYSICAL EXAMINATION:  Physical Exam  Constitutional: She appears well-developed and well-nourished. No distress.  HENT:  Head: Normocephalic and atraumatic.  Right Ear: External ear normal.  Left Ear: External ear normal.  Nose: Nose normal.  Mouth/Throat: Oropharynx is clear and moist. No oropharyngeal exudate.  Eyes: EOM are normal. Pupils are equal, round, and reactive to light. No scleral icterus.  Neck: Normal range of motion. Neck supple. No JVD present. No thyromegaly present.  Cardiovascular: Normal rate, regular rhythm, normal heart sounds and intact distal pulses.  Exam reveals no friction rub.   No murmur heard. Respiratory: Effort normal and breath sounds normal. No respiratory distress. She has no wheezes. She has no rales. She exhibits no tenderness.  GI: Soft. Bowel sounds are normal. She exhibits no distension and no mass. There is no tenderness. There is no rebound and no guarding.  Musculoskeletal: Normal range of motion. She exhibits no edema.  Lymphadenopathy:    She has no cervical adenopathy.  Neurological: She is alert. She has normal reflexes. She displays normal reflexes. No cranial nerve deficit. She exhibits normal muscle tone.  Responding to name only, remains confused.  Skin: Skin is warm. No rash noted. No erythema.  Psychiatric:  Unable to perform since patient confused   LABORATORY PANEL:   CBC  Recent Labs Lab 06/04/15 0342  WBC 16.1*  HGB 14.2  HCT 43.1  PLT 148*   ------------------------------------------------------------------------------------------------------------------  Chemistries   Recent Labs Lab 06/04/15 0342  NA 143  K 3.8  CL 112*  CO2 23  GLUCOSE 118*  BUN 30*  CREATININE 1.51*  CALCIUM 9.3  AST 32  ALT 19  ALKPHOS 100  BILITOT 1.0    ------------------------------------------------------------------------------------------------------------------  Cardiac Enzymes  Recent Labs Lab 06/04/15 0342  TROPONINI <0.03   ------------------------------------------------------------------------------------------------------------------  RADIOLOGY:  Ct Abdomen Pelvis Wo Contrast  06/04/2015   CLINICAL DATA:  81 year old female with apparent abdominal pain on exam.  EXAM: CT ABDOMEN AND PELVIS WITHOUT CONTRAST  TECHNIQUE: Multidetector CT imaging of the abdomen and pelvis was performed following the standard protocol without IV contrast.  COMPARISON:  None.  FINDINGS: Lower chest: Ill-defined patchy opacities in the bilateral lower lobes and right middle lobe. Coronary artery calcifications are seen. Atherosclerosis of the descending thoracic aorta.  Liver: Normal unenhanced appearance.  Hepatobiliary: Gallbladder physiologically distended. No biliary dilatation.  Pancreas: No ductal dilatation or surrounding inflammation.  Spleen: Normal.  Adrenal glands: No nodule.  Kidneys: Symmetric in size without hydronephrosis or perinephric stranding. Mental cortical scarring in the posterior left kidney. Bilateral renal cysts.  Stomach/Bowel: Stomach physiologically distended. Small hiatal hernia. There are no dilated or thickened small bowel loops. Small volume of stool throughout the colon without colonic wall thickening. Minimal diverticulosis without diverticulitis. The appendix is not confidently identified,  no pericecal or right lower quadrant inflammatory change.  Vascular/Lymphatic: No retroperitoneal adenopathy. Abdominal aorta is normal in caliber. Moderate to dense atherosclerosis of the abdominal aorta without aneurysm.  Reproductive: Calcified uterine fibroids.  No adnexal mass.  Bladder: Physiologically distended, no wall thickening.  Other: No free air, free fluid, or intra-abdominal fluid collection.  Musculoskeletal: Degenerative  change throughout the spine.  IMPRESSION: 1. No acute abnormality in the abdomen/pelvis. 2. Minimal patchy ill-defined opacities at the lung bases and right middle lobe. This may be infectious, inflammatory, or represent aspiration.   Electronically Signed   By: Rubye Oaks M.D.   On: 06/04/2015 03:55   Dg Chest 2 View  06/03/2015   CLINICAL DATA:  Difficulty swallowing food.  Shortness of breath.  EXAM: CHEST  2 VIEW  COMPARISON:  12/21/2014; 11/23/2014  FINDINGS: Grossly unchanged cardiac silhouette and mediastinal contours. Unchanged mild diffuse though basilar prominent nodular thickening of the pulmonary interstitium. No new focal airspace opacities. No pleural effusion or pneumothorax. No evidence of edema. No acute osseus abnormalities.  IMPRESSION: Chronic bronchitic change without acute cardiopulmonary disease.   Electronically Signed   By: Simonne Come M.D.   On: 06/03/2015 14:06   Ct Head Wo Contrast  06/04/2015   CLINICAL DATA:  Acute onset of altered mental status.  EXAM: CT HEAD WITHOUT CONTRAST  TECHNIQUE: Contiguous axial images were obtained from the base of the skull through the vertex without intravenous contrast.  COMPARISON:  Head CT 1 day prior 06/03/2015  FINDINGS: Stable atrophy and chronic small vessel ischemia. No intracranial hemorrhage, mass effect, or midline shift. No hydrocephalus. The basilar cisterns are patent. No evidence of territorial infarct. No intracranial fluid collection. Calvarium is intact. Post bilateral ocular surgery. Included paranasal sinuses and mastoid air cells are well aerated.  IMPRESSION: 1.  No acute intracranial abnormality. 2. Stable atrophy and chronic small vessel ischemic change.   Electronically Signed   By: Rubye Oaks M.D.   On: 06/04/2015 03:49   Ct Head Wo Contrast  06/03/2015   CLINICAL DATA:  Difficulty swallowing, dizziness, numbness and shortness of breath. Problems with coordination and memory beginning this week.  EXAM: CT HEAD  WITHOUT CONTRAST  TECHNIQUE: Contiguous axial images were obtained from the base of the skull through the vertex without intravenous contrast.  COMPARISON:  12/21/2014 and 09/26/2014  FINDINGS: Ventricles and cisterns are within normal. There is mild age related atrophic change which is stable. There is moderate chronic ischemic microvascular disease. Old lacunar infarct over the inferior right lentiform nucleus and left thalamus. Minimal basal ganglia calcifications. No evidence of mass, mass effect, shift of midline structures or acute hemorrhage. No evidence to suggest acute infarction. Remaining bones and soft tissues are within normal.  IMPRESSION: No acute intracranial findings.  Moderate chronic ischemic microvascular disease and old bilateral lacunar infarcts. Age related atrophy.   Electronically Signed   By: Elberta Fortis M.D.   On: 06/03/2015 16:38   Dg Chest Portable 1 View  06/04/2015   CLINICAL DATA:  Altered mental status, possible aspiration.  EXAM: PORTABLE CHEST - 1 VIEW  COMPARISON:  Frontal and lateral views yesterday.  FINDINGS: Bibasilar opacities and ill-defined right suprahilar opacity, new from prior exam. There is underlying chronic bronchitic change. Cardiomediastinal contours are unchanged. There is no pulmonary edema, pneumothorax or pleural effusion. No acute osseous abnormalities seen.  IMPRESSION: Bibasilar opacities ill-defined right suprahilar opacity, may reflect aspiration.   Electronically Signed   By: Lujean Rave.D.  On: 06/04/2015 04:33    EKG:   Orders placed or performed during the hospital encounter of 06/04/15  . ED EKG  . ED EKG  . EKG 12-Lead  . EKG 12-Lead  . EKG 12-Lead  . EKG 12-Lead  Normal sinus rhythm with ventricular rate of 86 bpm, LAD  IMPRESSION AND PLAN:   81 year old nursing home resident, on DO NOT RESUSCITATE status, found to be with altered sensorium and low blood pressure to nursing home, evaluation the ED revealed elevated WBC  of 16.1, chest x-ray with  bibasilar opacities concerning for aspiration pneumonia. 1. Acute encephalopathy secondary to sepsis-possible aspiration pneumonia and UTI. 2. Sepsis-aspiration pneumonia and UTI. Plan: Admit, careful IV hydration, place her nothing by mouth due to concerns for aspiration and dysphagia. Continue IV antibiotics-vancomycin and clindamycin, follow blood cultures. 3. Vomiting and diarrhea. History of dysphagia with choking episode yesterday, status post evaluation in the ED with negative CT head and workup revealed UTI. CT of the abdomen and pelvis done today negative for intra-abdominal pathology. Rule out C. difficile colitis, rule out infectious causes. Plan: Nothing by mouth, IV fluids, continue metronidazole, follow-up stool cultures, C. difficile toxin. Contact isolation. 4. History of hypertension, currently blood pressure low. Hold antihypertensive medications, follow blood pressure monitoring. 5. History of seizure disorder, stable on home medications. Continue same. Seizure precautions.    All the records are reviewed and case discussed with ED provider.Marland Kitchen  CODE STATUS: DO NOT RESUSCITATE  TOTAL TIME TAKING CARE OF THIS PATIENT: 50 minutes.    Crissie Figures M.D on 06/04/2015 at 6:14 AM  Between 7am to 6pm - Pager - [phone]  After 6pm go to [email or web address] - password [email or web address] [facility]  [facility]  Office  [phone]  CC: Primary care physician; Jaclyn Shaggy, MD

## 2015-06-04 NOTE — ED Notes (Signed)
Pt agitated since in and out cath. Dr. Darnelle Catalan at bedside, breath sounds unchanged, pupils unchanged and response unchanged. cna to bedside to help encourage pt from removing monitoring equipment.

## 2015-06-04 NOTE — ED Provider Notes (Addendum)
[facility] Emergency Department Provider Note  ____________________________________________  Time seen: Approximately 4:22 AM  I have reviewed the triage vital signs and the nursing notes.   HISTORY  Chief Complaint Altered Mental Status  history limited by this  HPI [name] is a 80 y.o. female patient seen earlier today in the emergency room diagnosed with UTI sent back to nursing home. The nursing home tonight reports that she became less responsive and became hypotensive at the nursing home. EMS did not find hypotension when they got there. Patient is apparently much more confused than she had been. That is the history provided to me by the nursing home and by EMS I'm unable to obtain any other from the patient patient will not follow commands and is not really responsive although she will look at you when you call her name.   Past Medical History  Diagnosis Date  . Glaucoma   . Macular degeneration   . Memory change   . Seizures   . Dehydration   . Coronary artery disease   . Anxiety   . Hypertension     Patient Active Problem List   Diagnosis Date Noted  . Acute encephalopathy 06/04/2015  . Sepsis due to pneumonia 06/04/2015  . Diarrhea 06/04/2015  . HTN (hypertension) 06/04/2015    History reviewed. No pertinent past surgical history.  Current Outpatient Rx  Name  Route  Sig  Dispense  Refill  . aspirin 81 MG chewable tablet   Oral   Chew 81 mg by mouth daily.         . bimatoprost (LUMIGAN) 0.01 % SOLN   Right Eye   Place 1 drop into the right eye at bedtime.         . brimonidine-timolol (COMBIGAN) 0.2-0.5 % ophthalmic solution   Both Eyes   Place 1 drop into both eyes every 12 (twelve) hours.         . busPIRone (BUSPAR) 5 MG tablet   Oral   Take 5 mg by mouth 2 (two) times daily.         . hydrALAZINE (APRESOLINE) 10 MG tablet   Oral   Take 10 mg by mouth 4 (four) times daily.         Marland Kitchen levETIRAcetam  (KEPPRA) 500 MG tablet   Oral   Take 500 mg by mouth 2 (two) times daily.         . metoprolol tartrate (LOPRESSOR) 25 MG tablet   Oral   Take 12.5 mg by mouth 2 (two) times daily.         . Multiple Vitamins-Minerals (PRESERVISION AREDS) TABS   Oral   Take 1 tablet by mouth 2 (two) times daily.         . naproxen sodium (ANAPROX) 220 MG tablet   Oral   Take 220 mg by mouth 2 (two) times daily with a meal.         . tolterodine (DETROL) 1 MG tablet   Oral   Take 1 mg by mouth every other day.         . cephALEXin (KEFLEX) 500 MG capsule   Oral   Take 1 capsule (500 mg total) by mouth 2 (two) times daily.   14 capsule   0     Allergies Review of patient's allergies indicates no known allergies.  Family History  Problem Relation Age of Onset  . Family history unknown: Yes    Social History Social  History  Substance Use Topics  . Smoking status: Never Smoker   . Smokeless tobacco: None  . Alcohol Use: No    Review of Systems Unable to obtain due to altered mental status  ____________________________________________   PHYSICAL EXAM:  VITAL SIGNS: ED Triage Vitals  Enc Vitals Group     BP 06/04/15 0307 156/81 mmHg     Pulse Rate 06/04/15 0307 81     Resp 06/04/15 0307 20     Temp 06/04/15 0307 98.9 F (37.2 C)     Temp Source 06/04/15 0307 Rectal     SpO2 06/04/15 0307 96 %     Weight 06/04/15 0307 141 lb 12.1 oz (64.3 kg)     Height --      Head Cir --      Peak Flow --      Pain Score --      Pain Loc --      Pain Edu? --      Excl. in GC? --     Constitutional: Awake and alert but lying on her side and will not follow commands or respond much except look at you  when her name is called Eyes: Conjunctivae are normal. PERRL. EOMI. Head: Atraumatic. Nose: No congestion/rhinnorhea. Mouth/Throat: Mucous membranes are moist.  Oropharynx non-erythematous. Neck: No stridor.   Cardiovascular: Normal rate, regular rhythm. Grossly normal  heart sounds.  Good peripheral circulation. Respiratory: Normal respiratory effort.  No retractions. Lungs CTAB. Gastrointestinal: Soft patient winces on abdominal palpation. No distention. No abdominal bruits. No CVA tenderness. Musculoskeletal: No lower extremity tenderness nor edema.  No joint effusions. Neurologic:  Patient is not speaking although she does move all extremities equally and well Skin:  Skin is warm, dry and intact. No rash noted.   ____________________________________________   LABS (all labs ordered are listed, but only abnormal results are displayed)  Labs Reviewed  COMPREHENSIVE METABOLIC PANEL - Abnormal; Notable for the following:    Chloride 112 (*)    Glucose, Bld 118 (*)    BUN 30 (*)    Creatinine, Ser 1.51 (*)    GFR calc non Af Amer 29 (*)    GFR calc Af Amer 33 (*)    All other components within normal limits  BRAIN NATRIURETIC PEPTIDE - Abnormal; Notable for the following:    B Natriuretic Peptide 281.0 (*)    All other components within normal limits  LACTIC ACID, PLASMA - Abnormal; Notable for the following:    Lactic Acid, Venous 2.1 (*)    All other components within normal limits  CBC WITH DIFFERENTIAL/PLATELET - Abnormal; Notable for the following:    WBC 16.1 (*)    Platelets 148 (*)    Neutro Abs 14.8 (*)    Lymphs Abs 0.2 (*)    Monocytes Absolute 1.0 (*)    All other components within normal limits  URINALYSIS COMPLETEWITH MICROSCOPIC ([facility] ONLY) - Abnormal; Notable for the following:    Color, Urine YELLOW (*)    APPearance HAZY (*)    Leukocytes, UA 3+ (*)    Bacteria, UA RARE (*)    Squamous Epithelial / LPF 0-5 (*)    All other components within normal limits  BLOOD GAS, ARTERIAL - Abnormal; Notable for the following:    pO2, Arterial 67 (*)    Bicarbonate 20.0 (*)    Acid-base deficit 4.1 (*)    All other components within normal limits  CULTURE, BLOOD (ROUTINE X 2)  CULTURE, BLOOD (  ROUTINE X 2)  URINE CULTURE   TROPONIN I  LACTIC ACID, PLASMA  URINE DRUG SCREEN, QUALITATIVE ([facility] ONLY)   ____________________________________________  EKG  EKG read and interpreted by me shows normal sinus rhythm at a rate of 86 left axis no acute ST-T wave changes ____________________________________________  RADIOLOGY  CT of the head shows no acute changes. CT of the abdomen only shows possible pneumonia bilaterally which may be due to aspiration. Patient reportedly vomited in CT patient has also vomited after returning from CT here will fall and was being placed ____________________________________________   PROCEDURES    ____________________________________________   INITIAL IMPRESSION / ASSESSMENT AND PLAN / ED COURSE  Pertinent labs & imaging results that were available during my care of the patient were reviewed by me and considered in my medical decision making (see chart for details).   ____________________________________________   FINAL CLINICAL IMPRESSION(S) / ED DIAGNOSES  Final diagnoses:  Healthcare-associated pneumonia  Altered mental status, unspecified altered mental status type  Dehydration      Arnaldo Natal, MD 06/04/15 0740 Patient is a DO NOT RESUSCITATE the nursing home faxed a DO NOT RESUSCITATE sheet to [place]  Arnaldo Natal, MD 06/04/15 [phone]

## 2015-06-05 LAB — BASIC METABOLIC PANEL
Anion gap: 4 — ABNORMAL LOW (ref 5–15)
BUN: 30 mg/dL — ABNORMAL HIGH (ref 6–20)
CALCIUM: 8.6 mg/dL — AB (ref 8.9–10.3)
CHLORIDE: 118 mmol/L — AB (ref 101–111)
CO2: 24 mmol/L (ref 22–32)
Creatinine, Ser: 1.48 mg/dL — ABNORMAL HIGH (ref 0.44–1.00)
GFR, EST AFRICAN AMERICAN: 34 mL/min — AB (ref >60)
GFR, EST NON AFRICAN AMERICAN: 29 mL/min — AB (ref >60)
Glucose, Bld: 92 mg/dL (ref 65–99)
Potassium: 3.6 mmol/L (ref 3.5–5.1)
SODIUM: 146 mmol/L — AB (ref 135–145)

## 2015-06-05 LAB — CBC
HEMATOCRIT: 34.3 % — AB (ref 35.0–47.0)
HEMOGLOBIN: 11.4 g/dL — AB (ref 12.0–16.0)
MCH: 31 pg (ref 26.0–34.0)
MCHC: 33.3 g/dL (ref 32.0–36.0)
MCV: 93 fL (ref 80.0–100.0)
Platelets: 125 10*3/uL — ABNORMAL LOW (ref 150–440)
RBC: 3.69 MIL/uL — ABNORMAL LOW (ref 3.80–5.20)
RDW: 14.1 % (ref 11.5–14.5)
WBC: 9.9 10*3/uL (ref 3.6–11.0)

## 2015-06-05 LAB — URINE CULTURE
Culture: >=100000
Special Requests: NORMAL

## 2015-06-05 MED ORDER — ENOXAPARIN SODIUM 30 MG/0.3ML ~~LOC~~ SOLN: 30.0000 mg | SUBCUTANEOUS | Status: DC

## 2015-06-05 NOTE — Evaluation (Signed)
Physical Therapy Evaluation Patient Details Name: DAZIYAH COGAN MRN: [id number] DOB: 26-Sep-1920 Today's Date: 06/05/2015   History of Present Illness  presented to ER from ALF secondary to AMS, hypotension; admitted with acute encephalopathy, sepsis related to PNA/UTI.  Hospital course significant for agitation/combativeness requiring administration of Haldol at times.  Clinical Impression  Upon evaluation, patient alert and oriented to self only.  Follows simple commands, but frequently requires redirection to task.  Demonstrates strength and ROM grossly WFL and functional for basic transfers and mobility; difficulty with formal MMT due to cognitive impairment.  Able to complete bed mobility, sit/stand, basic transfers and short-distance gait (30' x2) with RW, min assist.  Noted deficits in standing balance, safety awareness and overall insight; unsafe to attempt without RW and +1 at all times. Would benefit from skilled PT to address above deficits and promote optimal return to PLOF ; Recommend transition to HHPT with 24 hour sup/assist upon discharge from acute hospitalization.     Follow Up Recommendations Home health PT;Supervision/Assistance - 24 hour    Equipment Recommendations       Recommendations for Other Services       Precautions / Restrictions Precautions Precautions: Fall Restrictions Weight Bearing Restrictions: No      Mobility  Bed Mobility Overal bed mobility: Needs Assistance Bed Mobility: Supine to Sit     Supine to sit: Min assist        Transfers Overall transfer level: Needs assistance   Transfers: Sit to/from Stand Sit to Stand: Min assist         General transfer comment: multiple attempts and use of bilat UEs required to complete  Ambulation/Gait Ambulation/Gait assistance: Min assist Ambulation Distance (Feet): 30 Feet Assistive device: Rolling walker (2 wheeled)       General Gait Details: very narrowed BOS with limited heel  strike/toe off; very forward flexed posture, maintains RW arms length anterior to patient.  Excessive bilat knee flexion throughout gait cycle; poor dynamic balance.  Recommend RW and +1 at all times.  Stairs            Wheelchair Mobility    Modified Rankin (Stroke Patients Only)       Balance Overall balance assessment: Needs assistance Sitting-balance support: No upper extremity supported;Feet supported Sitting balance-Leahy Scale: Good     Standing balance support: Bilateral upper extremity supported Standing balance-Leahy Scale: Fair                               Pertinent Vitals/Pain Pain Assessment: No/denies pain    Home Living Family/patient expects to be discharged to:: Assisted living               Home Equipment: Walker - 2 wheels      Prior Function Level of Independence: Needs assistance         Comments: Per sitter at bedside, patient ambulates throughout room environment and completes ADLs with RW, close sup.  Utilizes manual WC for longer distances (to/from dining hall).  Was actively participating with PT at facility.  Does have hired Comptroller 5 days/week, 4 hours/day.     Hand Dominance        Extremity/Trunk Assessment   Upper Extremity Assessment: Overall WFL for tasks assessed           Lower Extremity Assessment: Overall WFL for tasks assessed (strength at least 3+/5, difficult to formally assess with MMT due to confusion)  Communication   Communication: No difficulties  Cognition Arousal/Alertness: Awake/alert Behavior During Therapy: WFL for tasks assessed/performed Overall Cognitive Status: History of cognitive impairments - at baseline                      General Comments      Exercises Other Exercises Other Exercises: Toilet transfer, ambulatory with RW, min assist--assist for safety with walker management, surface changes and overall task direction.  Sit/stand from standard toilet  height, min/mod assist with grab bar.  Standing balance for hygiene, clothing management, min assist. (8 min)      Assessment/Plan    PT Assessment Patient needs continued PT services  PT Diagnosis Difficulty walking;Generalized weakness   PT Problem List Decreased strength;Decreased range of motion;Decreased activity tolerance;Decreased balance;Decreased mobility;Decreased cognition;Decreased knowledge of use of DME;Decreased safety awareness;Decreased knowledge of precautions  PT Treatment Interventions DME instruction;Gait training;Functional mobility training;Therapeutic activities;Therapeutic exercise;Cognitive remediation;Balance training;Patient/family education   PT Goals (Current goals can be found in the Care Plan section) Acute Rehab PT Goals Patient Stated Goal: unable to verbalize PT Goal Formulation: With patient Time For Goal Achievement: 06/19/15 Potential to Achieve Goals: Fair    Frequency Min 2X/week   Barriers to discharge        Co-evaluation               End of Session Equipment Utilized During Treatment: Gait belt Activity Tolerance: Patient tolerated treatment well Patient left: in bed;with call bell/phone within reach;with bed alarm set;with nursing/sitter in room Nurse Communication: Mobility status         Time: 1346-1410 PT Time Calculation (min) (ACUTE ONLY): 24 min   Charges:   PT Evaluation $Initial PT Evaluation Tier I: 1 Procedure PT Treatments $Therapeutic Activity: 8-22 mins   PT G Codes:        Earl Losee H. Manson Passey, PT, DPT, NCS 06/05/2015, 3:01 PM [phone]

## 2015-06-05 NOTE — Progress Notes (Signed)
CSW contacted patient's HCPOA- niece- Tonette Lederer, [phone], who reports patient has been at [facility] ALF for 2 years next month. She is hopeful she will be able to return there at dc. CSW advised her the ALF has indicated they will need to re-assess her prior to return to confirm they are able to provide the level of care/assistance she requires.    Reece Levy, MSW, Theresia Majors  [phone]

## 2015-06-05 NOTE — Evaluation (Signed)
Clinical/Bedside Swallow Evaluation Patient Details  Name: [name] MRN: [id number] Date of Birth: 1919-11-16  Today's Date: 06/05/2015 Time: SLP Start Time (ACUTE ONLY): 1200 SLP Stop Time (ACUTE ONLY): 1300 SLP Time Calculation (min) (ACUTE ONLY): 60 min  Past Medical History:  Past Medical History  Diagnosis Date  . Glaucoma   . Macular degeneration   . Memory change   . Seizures   . Dehydration   . Coronary artery disease   . Anxiety   . Hypertension    Past Surgical History: History reviewed. No pertinent past surgical history. HPI:  Pt is a 81 y.o. female with a known history of hypertension, seizure disorder, Cognitive decline, anxiety, macular degeneration, CAD, and dehydration per report who resides at a nursing home. Pt is currently NPO d/t concern for aspiration. Noted per chart, pt had an episode of vomiting and may have aspirated during the episode. Pt denies any trouble swallowing and drinks at home w/out difficulty per her report.    Assessment / Plan / Recommendation Clinical Impression  Pt appears to present w/ reduced risk for aspiration at this time as she adequately tolerated trials of thin liquids and purees w/ no overt s/s of aspiration noted. Clear vocal quality noted b/t trials of thin liquids via straw/cup. No noted oral phase deficits w/ these consistencies. Solids were not tested d/t pt's lacking sufficient dentition for mastication of such. Pt does have some Cognitive decline and would benefit from monitoring during meals. Rec. a pureed diet w/ thin liquids; aspiration precautions; meds in Puree; monitoring at meals.     Aspiration Risk   (reduced)    Diet Recommendation Dysphagia 1 (Puree);Thin   Medication Administration: Whole meds with puree (crush if nec./able) Compensations: Slow rate;Small sips/bites;Minimize environmental distractions    Other  Recommendations Recommended Consults:  (Dietician) Oral Care Recommendations: Oral care  BID;Staff/trained caregiver to provide oral care   Follow Up Recommendations       Frequency and Duration min 3x week  1 week   Pertinent Vitals/Pain denied    SLP Swallow Goals  see care plan   Swallow Study Prior Functional Status   resides at [facility]; denied any trouble swallowing w/ usual diet at [facility] [place].    General Date of Onset: 06/04/15 Other Pertinent Information: Pt is a 81 y.o. female with a known history of hypertension, seizure disorder, Cognitive decline, anxiety, macular degeneration, CAD, and dehydration per report who resides at a nursing home. Pt is currently NPO d/t concern for aspiration. Noted per chart, pt had an episode of vomiting and may have aspirated during the episode. Pt denies any trouble swallowing and drinks at home w/out difficulty per her report.  Type of Study: Bedside swallow evaluation Previous Swallow Assessment: none indicated Diet Prior to this Study: Thin liquids;Regular (but recently lost her upper dentures/partial) Temperature Spikes Noted: No (wbc wnl) Respiratory Status: Room air History of Recent Intubation: No Behavior/Cognition: Alert;Cooperative;Pleasant mood;Confused;Requires cueing;Distractible Oral Cavity - Dentition: Poor condition (noted some lower dentition w/ 1-2 upper teeth) Self-Feeding Abilities: Able to feed self;Needs assist;Needs set up Patient Positioning: Upright in bed Baseline Vocal Quality: Normal Volitional Cough:  (NT) Volitional Swallow: Able to elicit    Oral/Motor/Sensory Function Overall Oral Motor/Sensory Function: Appears within functional limits for tasks assessed Labial ROM: Within Functional Limits Labial Symmetry: Within Functional Limits Labial Strength: Within Functional Limits Lingual ROM: Within Functional Limits Lingual Symmetry: Within Functional Limits Lingual Strength: Within Functional Limits Facial Symmetry: Within Functional Limits Mandible: Within Functional  Limits   Ice Chips Ice chips: Within  functional limits Presentation: Spoon (fed; 3 trials)   Thin Liquid Thin Liquid: Within functional limits Presentation: Cup;Straw;Self Fed (assisted; 9 trials)    Nectar Thick Nectar Thick Liquid: Not tested   Honey Thick Honey Thick Liquid: Not tested   Puree Puree: Within functional limits Presentation: Self Fed;Spoon (assisted; 6+ trials)   Solid   GO    Solid: Not tested       Tasha Diaz 06/05/2015,3:21 PM

## 2015-06-05 NOTE — Clinical Documentation Improvement (Signed)
Hospitalist  Abnormal Lab/Test Results:   Bun    Creat     GFR: 8/28:   30       1.51     29 8/29:   30       1.48     29   Possible Clinical Conditions associated with below indicators  Acute Renal Failure  Acute on Chronic Renal Failure  Other Condition  Cannot Clinically Determine   Please exercise your independent, professional judgment when responding. A specific answer is not anticipated or expected.   Thank You,  Rodman Pickle [facility] [employer] [facility] [place]

## 2015-06-05 NOTE — Progress Notes (Signed)
[facility] - [place] at [facility]   PATIENT NAME: [name]    MR#:  [id number]  DATE OF BIRTH:  1920/08/20  SUBJECTIVE:  CHIEF COMPLAINT:   Chief Complaint  Patient presents with  . Altered Mental Status   No complaints this morning. Oriented to person, not place or date.  Calm and appropriate  REVIEW OF SYSTEMS:   Review of Systems  Constitutional: Negative for fever.  Respiratory: Negative for shortness of breath.   Cardiovascular: Negative for chest pain and palpitations.  Gastrointestinal: Negative for nausea, vomiting and abdominal pain.  Genitourinary: Positive for frequency. Negative for dysuria.    DRUG ALLERGIES:  No Known Allergies  VITALS:  Blood pressure 146/66, pulse 69, temperature 98 F (36.7 C), temperature source Oral, resp. rate 20, height  (1.626 m), weight 64.139 kg (141 lb 6.4 oz), SpO2 100 %.  PHYSICAL EXAMINATION:  GENERAL:  81 y.o.-year-old patient lying in the bed with no acute distress.  LUNGS: Normal breath sounds bilaterally, no wheezing, rales,rhonchi or crepitation. No use of accessory muscles of respiration.  CARDIOVASCULAR: S1, S2 normal. No murmurs, rubs, or gallops.  ABDOMEN: Soft, nontender, nondistended. Bowel sounds present. No organomegaly or mass.  EXTREMITIES: No pedal edema, cyanosis, or clubbing.  NEUROLOGIC: Cranial nerves II through XII are intact. Muscle strength 5/5 in all extremities. Sensation intact. Gait not checked. Thin with muscle wasting PSYCHIATRIC: The patient is alert and oriented to person only SKIN: No obvious rash, lesion, or ulcer.    LABORATORY PANEL:   CBC  Recent Labs Lab 06/05/15 0511  WBC 9.9  HGB 11.4*  HCT 34.3*  PLT 125*   ------------------------------------------------------------------------------------------------------------------  Chemistries   Recent Labs Lab 06/04/15 0342 06/05/15 0511  NA 143 146*  K 3.8 3.6  CL 112* 118*  CO2 23 24   GLUCOSE 118* 92  BUN 30* 30*  CREATININE 1.51* 1.48*  CALCIUM 9.3 8.6*  AST 32  --   ALT 19  --   ALKPHOS 100  --   BILITOT 1.0  --    ------------------------------------------------------------------------------------------------------------------  Cardiac Enzymes  Recent Labs Lab 06/04/15 0342  TROPONINI <0.03   ------------------------------------------------------------------------------------------------------------------  RADIOLOGY:  Ct Abdomen Pelvis Wo Contrast  06/04/2015   CLINICAL DATA:  81 year old female with apparent abdominal pain on exam.  EXAM: CT ABDOMEN AND PELVIS WITHOUT CONTRAST  TECHNIQUE: Multidetector CT imaging of the abdomen and pelvis was performed following the standard protocol without IV contrast.  COMPARISON:  None.  FINDINGS: Lower chest: Ill-defined patchy opacities in the bilateral lower lobes and right middle lobe. Coronary artery calcifications are seen. Atherosclerosis of the descending thoracic aorta.  Liver: Normal unenhanced appearance.  Hepatobiliary: Gallbladder physiologically distended. No biliary dilatation.  Pancreas: No ductal dilatation or surrounding inflammation.  Spleen: Normal.  Adrenal glands: No nodule.  Kidneys: Symmetric in size without hydronephrosis or perinephric stranding. Mental cortical scarring in the posterior left kidney. Bilateral renal cysts.  Stomach/Bowel: Stomach physiologically distended. Small hiatal hernia. There are no dilated or thickened small bowel loops. Small volume of stool throughout the colon without colonic wall thickening. Minimal diverticulosis without diverticulitis. The appendix is not confidently identified, no pericecal or right lower quadrant inflammatory change.  Vascular/Lymphatic: No retroperitoneal adenopathy. Abdominal aorta is normal in caliber. Moderate to dense atherosclerosis of the abdominal aorta without aneurysm.  Reproductive: Calcified uterine fibroids.  No adnexal mass.  Bladder:  Physiologically distended, no wall thickening.  Other: No free air, free fluid, or intra-abdominal fluid collection.  Musculoskeletal: Degenerative change throughout the spine.  IMPRESSION: 1. No acute abnormality in the abdomen/pelvis. 2. Minimal patchy ill-defined opacities at the lung bases and right middle lobe. This may be infectious, inflammatory, or represent aspiration.   Electronically Signed   By: Rubye Oaks M.D.   On: 06/04/2015 03:55   Dg Chest 2 View  06/03/2015   CLINICAL DATA:  Difficulty swallowing food.  Shortness of breath.  EXAM: CHEST  2 VIEW  COMPARISON:  12/21/2014; 11/23/2014  FINDINGS: Grossly unchanged cardiac silhouette and mediastinal contours. Unchanged mild diffuse though basilar prominent nodular thickening of the pulmonary interstitium. No new focal airspace opacities. No pleural effusion or pneumothorax. No evidence of edema. No acute osseus abnormalities.  IMPRESSION: Chronic bronchitic change without acute cardiopulmonary disease.   Electronically Signed   By: Simonne Come M.D.   On: 06/03/2015 14:06   Ct Head Wo Contrast  06/04/2015   CLINICAL DATA:  Acute onset of altered mental status.  EXAM: CT HEAD WITHOUT CONTRAST  TECHNIQUE: Contiguous axial images were obtained from the base of the skull through the vertex without intravenous contrast.  COMPARISON:  Head CT 1 day prior 06/03/2015  FINDINGS: Stable atrophy and chronic small vessel ischemia. No intracranial hemorrhage, mass effect, or midline shift. No hydrocephalus. The basilar cisterns are patent. No evidence of territorial infarct. No intracranial fluid collection. Calvarium is intact. Post bilateral ocular surgery. Included paranasal sinuses and mastoid air cells are well aerated.  IMPRESSION: 1.  No acute intracranial abnormality. 2. Stable atrophy and chronic small vessel ischemic change.   Electronically Signed   By: Rubye Oaks M.D.   On: 06/04/2015 03:49   Ct Head Wo Contrast  06/03/2015   CLINICAL  DATA:  Difficulty swallowing, dizziness, numbness and shortness of breath. Problems with coordination and memory beginning this week.  EXAM: CT HEAD WITHOUT CONTRAST  TECHNIQUE: Contiguous axial images were obtained from the base of the skull through the vertex without intravenous contrast.  COMPARISON:  12/21/2014 and 09/26/2014  FINDINGS: Ventricles and cisterns are within normal. There is mild age related atrophic change which is stable. There is moderate chronic ischemic microvascular disease. Old lacunar infarct over the inferior right lentiform nucleus and left thalamus. Minimal basal ganglia calcifications. No evidence of mass, mass effect, shift of midline structures or acute hemorrhage. No evidence to suggest acute infarction. Remaining bones and soft tissues are within normal.  IMPRESSION: No acute intracranial findings.  Moderate chronic ischemic microvascular disease and old bilateral lacunar infarcts. Age related atrophy.   Electronically Signed   By: Elberta Fortis M.D.   On: 06/03/2015 16:38   Dg Chest Portable 1 View  06/04/2015   CLINICAL DATA:  Altered mental status, possible aspiration.  EXAM: PORTABLE CHEST - 1 VIEW  COMPARISON:  Frontal and lateral views yesterday.  FINDINGS: Bibasilar opacities and ill-defined right suprahilar opacity, new from prior exam. There is underlying chronic bronchitic change. Cardiomediastinal contours are unchanged. There is no pulmonary edema, pneumothorax or pleural effusion. No acute osseous abnormalities seen.  IMPRESSION: Bibasilar opacities ill-defined right suprahilar opacity, may reflect aspiration.   Electronically Signed   By: Rubye Oaks M.D.   On: 06/04/2015 04:33    EKG:   Orders placed or performed during the hospital encounter of 06/04/15  . ED EKG  . ED EKG  . EKG 12-Lead  . EKG 12-Lead  . EKG 12-Lead  . EKG 12-Lead  . EKG    ASSESSMENT AND PLAN:    81 year old nursing  home resident, on DO NOT RESUSCITATE status, found to be  with altered sensorium and low blood pressure to nursing home, evaluation the ED revealed elevated WBC of 16.1, chest x-ray with bibasilar opacities concerning for aspiration pneumonia.  1. Acute encephalopathy secondary to sepsis-possible aspiration pneumonia and UTI. - she does have dementia at baseline - has required haldol for behavior disturbance while inpatient  2. Sepsis-aspiration pneumonia and UTI. - resolving, VSS and leucocytosis improved - blood and urine cultures pending - will dc flagyl/clinda and continue rocephin only  3. Vomiting and diarrhea - no further diarrhea, no colitis on CT - possible aspiration, will get ST eval  4. Acute on CKD 3 - continue IVF - likely due to azotemia and hypotension on admission  5. HTN - BP stable, continue to hold antihypertensives  6. Seizure history - no events, continue home medications  7. Weakness - PT evaluation  CODE STATUS: DNR  TOTAL TIME TAKING CARE OF THIS PATIENT: 25 minutes.  Greater than 50% of time spent in care coordination and counseling. Spoke with HCPOA on the phone, Care Management and nursing. POSSIBLE D/C IN 1-2 DAYS, DEPENDING ON CLINICAL CONDITION.   Elby Showers M.D on 06/05/2015 at 12:09 PM  Between 7am to 6pm - Pager - [phone]  After 6pm go to [email or web address] - password [email or web address] [facility]  [place] [facility]  Office  [phone]  CC: Primary care physician; Jaclyn Shaggy, MD

## 2015-06-05 NOTE — Plan of Care (Signed)
Problem: Discharge Progression Outcomes Goal: Other Discharge Outcomes/Goals Outcome: Progressing No complaints of pain. VSS. Speech eval performed. Tolerating diet, no complaints of nausea.

## 2015-06-06 DIAGNOSIS — N390 Urinary tract infection, site not specified: Secondary | ICD-10-CM | POA: Diagnosis present

## 2015-06-06 LAB — CBC
HCT: 35.4 % (ref 35.0–47.0)
HEMOGLOBIN: 11.7 g/dL — AB (ref 12.0–16.0)
MCH: 31.1 pg (ref 26.0–34.0)
MCHC: 33.1 g/dL (ref 32.0–36.0)
MCV: 94 fL (ref 80.0–100.0)
PLATELETS: 125 10*3/uL — AB (ref 150–440)
RBC: 3.77 MIL/uL — AB (ref 3.80–5.20)
RDW: 14.3 % (ref 11.5–14.5)
WBC: 6.5 10*3/uL (ref 3.6–11.0)

## 2015-06-06 LAB — URINE CULTURE: SPECIAL REQUESTS: NORMAL

## 2015-06-06 LAB — BASIC METABOLIC PANEL
ANION GAP: 3 — AB (ref 5–15)
BUN: 25 mg/dL — AB (ref 6–20)
CHLORIDE: 116 mmol/L — AB (ref 101–111)
CO2: 24 mmol/L (ref 22–32)
Calcium: 8.6 mg/dL — ABNORMAL LOW (ref 8.9–10.3)
Creatinine, Ser: 1.21 mg/dL — ABNORMAL HIGH (ref 0.44–1.00)
GFR, EST AFRICAN AMERICAN: 43 mL/min — AB (ref >60)
GFR, EST NON AFRICAN AMERICAN: 37 mL/min — AB (ref >60)
Glucose, Bld: 91 mg/dL (ref 65–99)
POTASSIUM: 3.7 mmol/L (ref 3.5–5.1)
SODIUM: 143 mmol/L (ref 135–145)

## 2015-06-06 MED ORDER — LEVOFLOXACIN 500 MG PO TABS: 500.0000 mg | ORAL_TABLET | Freq: Every day | ORAL | Status: DC

## 2015-06-06 MED ORDER — SULFAMETHOXAZOLE-TRIMETHOPRIM 800-160 MG PO TABS: 1.0000 | ORAL_TABLET | Freq: Two times a day (BID) | ORAL | Status: DC

## 2015-06-06 NOTE — Care Management (Signed)
Admitted to [facility] with the diagnosis of acute encephalopathy. A resident of [facility] since 06/09/13. Contact is Geryl Rankins [phone] or [phone]). Sees Dr. Dewaine Oats as primary care physician.  Discharge back to [facility] today per Dr. Clent Ridges. Gwenette Greet RN MSN Care Management [phone]

## 2015-06-06 NOTE — Progress Notes (Signed)
Patient for d/c today to ALF bed at [facility]. Niece/HCPOA aware and agreeable to this plan- plan transfer via ALF staff/can. Clinicals faxed to ALF- . Reece Levy, MSW, Theresia Majors [phone]

## 2015-06-06 NOTE — Plan of Care (Signed)
Problem: Discharge Progression Outcomes Goal: Other Discharge Outcomes/Goals Plan of care progress to goal: - No complaints of pain. - incontinent of urine. - VSS. - Continues ABX. - Will continue to monitor.

## 2015-06-06 NOTE — Discharge Summary (Signed)
[facility] - [place] at [facility]  DISCHARGE SUMMARY   PATIENT NAME: [name]    MR#:  [id number]  DATE OF BIRTH:  1920/08/16  DATE OF ADMISSION:  06/04/2015 ADMITTING PHYSICIAN: Gale Journey, MD  DATE OF DISCHARGE: 06/06/2015  PRIMARY CARE PHYSICIAN: Jaclyn Shaggy, MD    ADMISSION DIAGNOSIS:  Dehydration [E86.0] Healthcare-associated pneumonia [J18.9] Altered mental status, unspecified altered mental status type [R41.82]  DISCHARGE DIAGNOSIS:  Principal Problem:   Acute encephalopathy Active Problems:   Sepsis due to pneumonia   Diarrhea   HTN (hypertension)   UTI (urinary tract infection)   SECONDARY DIAGNOSIS:   Past Medical History  Diagnosis Date  . Glaucoma   . Macular degeneration   . Memory change   . Seizures   . Dehydration   . Coronary artery disease   . Anxiety   . Hypertension     HOSPITAL COURSE:    81 year old nursing home resident, on DO NOT RESUSCITATE status, found to be with altered sensorium and low blood pressure to nursing home, evaluation the ED revealed elevated WBC of 16.1, chest x-ray with bibasilar opacities concerning for aspiration pneumonia and urinary tract infection.  1. Acute encephalopathy secondary to sepsis-possible aspiration pneumonia and UTI now resolved. She has dementia at baseline. She had significant behavioral disturbance while inpatient. Currently calm answering questions appropriately. She seems to be back to her baseline.  2. Sepsis-aspiration pneumonia and UTI. Now resolved. Blood cultures negative. Urine cultures showing pansensitive Escherichia coli. She was initially treated with Flagyl, clindamycin and Rocephin. This was simplified to Rocephin only. Now upon discharge she can be transitioned to oral Levaquin which would cover both pneumonia and urinary tract infection.  3. Vomiting and diarrhea: Likely due to urinary tract infection. She has had no further diarrhea while  inpatient. CT scan negative for colitis. She did have speech therapy evaluation during hospitalization which was negative for any sort of swallowing dysfunction.  4. Acute on CKD 3: Renal function is now back to baseline. She likely had prerenal azotemia due to decreased by mouth intake and also hypotension due to infection.  5. HTN: Blood pressure has been well-controlled. She can resume low-dose hydralazine and metoprolol on discharge  6. Seizure history: No events during hospitalization, continue home medications  7. Weakness: Physical therapy evaluation is for home health physical therapy. This will be established prior to discharge back to assisted living  DISCHARGE CONDITIONS:   Stable  CONSULTS OBTAINED:    none  DRUG ALLERGIES:  No Known Allergies  DISCHARGE MEDICATIONS:   Current Discharge Medication List    START taking these medications   Details  levofloxacin (LEVAQUIN) 500 MG tablet Take 1 tablet (500 mg total) by mouth daily. Qty: 8 tablet, Refills: 0      CONTINUE these medications which have NOT CHANGED   Details  aspirin 81 MG chewable tablet Chew 81 mg by mouth daily.    bimatoprost (LUMIGAN) 0.01 % SOLN Place 1 drop into the right eye at bedtime.    brimonidine-timolol (COMBIGAN) 0.2-0.5 % ophthalmic solution Place 1 drop into both eyes every 12 (twelve) hours.    busPIRone (BUSPAR) 5 MG tablet Take 5 mg by mouth 2 (two) times daily.    hydrALAZINE (APRESOLINE) 10 MG tablet Take 10 mg by mouth 4 (four) times daily.    levETIRAcetam (KEPPRA) 500 MG tablet Take 500 mg by mouth 2 (two) times daily.    metoprolol tartrate (LOPRESSOR) 25 MG tablet Take 12.5  mg by mouth 2 (two) times daily.    Multiple Vitamins-Minerals (PRESERVISION AREDS) TABS Take 1 tablet by mouth 2 (two) times daily.    naproxen sodium (ANAPROX) 220 MG tablet Take 220 mg by mouth 2 (two) times daily with a meal.    tolterodine (DETROL) 1 MG tablet Take 1 mg by mouth every other day.       STOP taking these medications     cephALEXin (KEFLEX) 500 MG capsule          DISCHARGE INSTRUCTIONS:    DIET:  Regular diet  DISCHARGE CONDITION:  Stable  ACTIVITY:  Activity as tolerated  OXYGEN:  Home Oxygen: No.   Oxygen Delivery: room air  DISCHARGE LOCATION:  Assisted living facility   If you experience worsening of your admission symptoms, develop shortness of breath, life threatening emergency, suicidal or homicidal thoughts you must seek medical attention immediately by calling 911 or calling your MD immediately  if symptoms less severe.  You Must read complete instructions/literature along with all the possible adverse reactions/side effects for all the Medicines you take and that have been prescribed to you. Take any new Medicines after you have completely understood and accpet all the possible adverse reactions/side effects.   Please note  You were cared for by a hospitalist during your hospital stay. If you have any questions about your discharge medications or the care you received while you were in the hospital after you are discharged, you can call the unit and asked to speak with the hospitalist on call if the hospitalist that took care of you is not available. Once you are discharged, your primary care physician will handle any further medical issues. Please note that NO REFILLS for any discharge medications will be authorized once you are discharged, as it is imperative that you return to your primary care physician (or establish a relationship with a primary care physician if you do not have one) for your aftercare needs so that they can reassess your need for medications and monitor your lab values.   Today   CHIEF COMPLAINT:   Chief Complaint  Patient presents with  . Altered Mental Status    HISTORY OF PRESENT ILLNESS:  [name] is a 81 y.o. female with a known history of hypertension, seizure disorder, nursing home resident, on DO  NOT RESUSCITATE status was found to be with altered sensorium with low blood pressure by the nursing home staff, hence sent to the emergency room for further evaluation. In the emergency room patient was evaluated by the ED physician and was found to be confused with borderline low blood pressure of 102/50. Workup revealed elevated white blood cell count of 16.7, BUN/creatinine 30 over 1.51 and chest x-ray with by basilar opacities which may reflect aspiration. Patient was also noted to have diarrhea and vomiting and a CT of the abdomen and pelvis was reported as negative intra-abdominal/pelvis, minimal patchy ill-defined opacities in the lung bases. Off note patient was earlier seen in the ED yesterday for altered sensorium and workup revealed UTI and was sent to nursing home on by mouth Keflex. CT head done at that time revealed no acute intracranial abnormality. After obtaining blood and urine cultures, patient was started on IV antibiotics-Rocephin and clindamycin, was given IV fluids and hospitalist service was consulted for further management. Patient remains confused, responding to her name, moving all 4 limbs and not able to obtain any further history. Patient's blood pressure is currently maintaining at 115/62.  VITAL  SIGNS:  Blood pressure 143/57, pulse 61, temperature 98.3 F (36.8 C), temperature source Oral, resp. rate 18, height  (1.626 m), weight 64.184 kg (141 lb 8 oz), SpO2 97 %.  I/O:    Intake/Output Summary (Last 24 hours) at 06/06/15 0905 Last data filed at 06/06/15 0809  Gross per 24 hour  Intake    170 ml  Output    800 ml  Net   -630 ml    PHYSICAL EXAMINATION:  GENERAL:  81 y.o.-year-old patient lying in the bed with no acute distress.  EYES: Pupils equal, round, reactive to light and accommodation. No scleral icterus. Extraocular muscles intact.  HEENT: Head atraumatic, normocephalic. Oropharynx and nasopharynx clear.  NECK:  Supple, no jugular venous distention.  No thyroid enlargement, no tenderness.  LUNGS: Normal breath sounds bilaterally, no wheezing, rales,rhonchi or crepitation. No use of accessory muscles of respiration.  CARDIOVASCULAR: S1, S2 normal. No murmurs, rubs, or gallops.  ABDOMEN: Soft, non-tender, non-distended. Bowel sounds present. No organomegaly or mass.  EXTREMITIES: No pedal edema, cyanosis, or clubbing.  NEUROLOGIC: Cranial nerves II through XII are intact. Muscle strength 5/5 in all extremities. Sensation intact. Gait not checked.  PSYCHIATRIC: The patient is alert and oriented to person but not to place  SKIN: No obvious rash, lesion, or ulcer.   DATA REVIEW:   CBC  Recent Labs Lab 06/06/15 0520  WBC 6.5  HGB 11.7*  HCT 35.4  PLT 125*    Chemistries   Recent Labs Lab 06/04/15 0342  06/06/15 0520  NA 143  < > 143  K 3.8  < > 3.7  CL 112*  < > 116*  CO2 23  < > 24  GLUCOSE 118*  < > 91  BUN 30*  < > 25*  CREATININE 1.51*  < > 1.21*  CALCIUM 9.3  < > 8.6*  AST 32  --   --   ALT 19  --   --   ALKPHOS 100  --   --   BILITOT 1.0  --   --   < > = values in this interval not displayed.  Cardiac Enzymes  Recent Labs Lab 06/04/15 0342  TROPONINI <0.03    Microbiology Results  Results for orders placed or performed during the hospital encounter of 06/04/15  Culture, blood (routine x 2)     Status: None (Preliminary result)   Collection Time: 06/04/15  3:10 AM  Result Value Ref Range Status   Specimen Description BLOOD RIGHT ARM  Final   Special Requests BOTTLES DRAWN AEROBIC AND ANAEROBIC 5CC  Final   Culture NO GROWTH 1 DAY  Final   Report Status PENDING  Incomplete  Culture, blood (routine x 2)     Status: None (Preliminary result)   Collection Time: 06/04/15  3:40 AM  Result Value Ref Range Status   Specimen Description BLOOD LEFT ARM  Final   Special Requests BOTTLES DRAWN AEROBIC AND ANAEROBIC 5CC  Final   Culture NO GROWTH 1 DAY  Final   Report Status PENDING  Incomplete  Urine culture      Status: None (Preliminary result)   Collection Time: 06/04/15  3:53 AM  Result Value Ref Range Status   Specimen Description URINE, RANDOM  Final   Special Requests Normal  Final   Culture   Final    20,000 COLONIES/mL GRAM NEGATIVE RODS <10,000 COLONIES/mL IDENTIFICATION AND SUSCEPTIBILITIES TO FOLLOW    Report Status PENDING  Incomplete    RADIOLOGY:  No results found.  EKG:   Orders placed or performed during the hospital encounter of 2015/06/09  . ED EKG  . ED EKG  . EKG 12-Lead  . EKG 12-Lead  . EKG 12-Lead  . EKG 12-Lead  . EKG      Management plans discussed with the patient, family and they are in agreement.  CODE STATUS:     Code Status Orders        Start     Ordered   June 09, 2015 [phone]  Do not attempt resuscitation (DNR)   Continuous    Question Answer Comment  In the event of cardiac or respiratory ARREST Do not call a "code blue"   In the event of cardiac or respiratory ARREST Do not perform Intubation, CPR, defibrillation or ACLS   In the event of cardiac or respiratory ARREST Use medication by any route, position, wound care, and other measures to relive pain and suffering. May use oxygen, suction and manual treatment of airway obstruction as needed for comfort.   Comments RN may pronounce death      2015-06-09 0841    Advance Directive Documentation        Most Recent Value   Type of Advance Directive  Out of facility DNR (pink MOST or yellow form)   Pre-existing out of facility DNR order (yellow form or pink MOST form)     "MOST" Form in Place?        TOTAL TIME TAKING CARE OF THIS PATIENT: 40 minutes.  Greater than 50% of time spent in care coordination and counseling.  Elby Showers M.D on 06/06/2015 at 9:05 AM  Between 7am to 6pm - Pager - [phone]  After 6pm go to [email or web address] - password [email or web address] [facility]  [place] [facility]  Office  [phone]  CC: Primary care physician; Jaclyn Shaggy, MD

## 2015-06-06 NOTE — Progress Notes (Signed)
Patient discharged back to [facility]. Transportation provided by facility. Bo Mcclintock, RN

## 2015-06-06 NOTE — Care Management Important Message (Signed)
Important Message  Patient Details  Name: [name] MRN: [id number] Date of Birth: 04-15-1920   Medicare Important Message Given:  Yes-second notification given    Verita Schneiders [place] 06/06/2015, 9:13 AM

## 2015-06-09 LAB — CULTURE, BLOOD (ROUTINE X 2)
CULTURE: NO GROWTH
Culture: NO GROWTH

## 2015-06-10 LAB — GLUCOSE, CAPILLARY
Glucose-Capillary: 104 mg/dL — ABNORMAL HIGH (ref 65–99)
Glucose-Capillary: 115 mg/dL — ABNORMAL HIGH (ref 65–99)

## 2016-02-14 ENCOUNTER — Encounter: Payer: Self-pay | Admitting: *Deleted

## 2016-02-14 ENCOUNTER — Emergency Department: Payer: Medicare Other

## 2016-02-14 ENCOUNTER — Emergency Department
Admission: EM | Admit: 2016-02-14 | Discharge: 2016-02-14 | Disposition: A | Discharge status: Home / Self Care | Payer: Medicare Other | Attending: Emergency Medicine | Admitting: Emergency Medicine

## 2016-02-14 DIAGNOSIS — Z8669 Personal history of other diseases of the nervous system and sense organs: Secondary | ICD-10-CM | POA: Diagnosis not present

## 2016-02-14 DIAGNOSIS — Z23 Encounter for immunization: Secondary | ICD-10-CM | POA: Insufficient documentation

## 2016-02-14 DIAGNOSIS — Y999 Unspecified external cause status: Secondary | ICD-10-CM | POA: Insufficient documentation

## 2016-02-14 DIAGNOSIS — Z79899 Other long term (current) drug therapy: Secondary | ICD-10-CM | POA: Insufficient documentation

## 2016-02-14 DIAGNOSIS — W1849XA Other slipping, tripping and stumbling without falling, initial encounter: Secondary | ICD-10-CM | POA: Diagnosis not present

## 2016-02-14 DIAGNOSIS — F0391 Unspecified dementia with behavioral disturbance: Secondary | ICD-10-CM | POA: Insufficient documentation

## 2016-02-14 DIAGNOSIS — Y939 Activity, unspecified: Secondary | ICD-10-CM | POA: Insufficient documentation

## 2016-02-14 DIAGNOSIS — S0003XA Contusion of scalp, initial encounter: Secondary | ICD-10-CM | POA: Diagnosis not present

## 2016-02-14 DIAGNOSIS — Z791 Long term (current) use of non-steroidal anti-inflammatories (NSAID): Secondary | ICD-10-CM | POA: Insufficient documentation

## 2016-02-14 DIAGNOSIS — Y929 Unspecified place or not applicable: Secondary | ICD-10-CM | POA: Insufficient documentation

## 2016-02-14 DIAGNOSIS — I10 Essential (primary) hypertension: Secondary | ICD-10-CM | POA: Insufficient documentation

## 2016-02-14 DIAGNOSIS — S098XXA Other specified injuries of head, initial encounter: Secondary | ICD-10-CM

## 2016-02-14 DIAGNOSIS — I2510 Atherosclerotic heart disease of native coronary artery without angina pectoris: Secondary | ICD-10-CM | POA: Insufficient documentation

## 2016-02-14 DIAGNOSIS — Z7982 Long term (current) use of aspirin: Secondary | ICD-10-CM | POA: Insufficient documentation

## 2016-02-14 DIAGNOSIS — S0990XA Unspecified injury of head, initial encounter: Secondary | ICD-10-CM | POA: Diagnosis present

## 2016-02-14 MED ORDER — TETANUS-DIPHTH-ACELL PERTUSSIS 5-2.5-18.5 LF-MCG/0.5 IM SUSP
0.5000 mL | Freq: Once | INTRAMUSCULAR | Status: AC
  Administered 2016-02-14: 0.5 mL via INTRAMUSCULAR
  Filled 2016-02-14: qty 0.5

## 2016-02-14 NOTE — Discharge Instructions (Signed)
Concussion, Adult °A concussion, or closed-head injury, is a brain injury caused by a direct blow to the head or by a quick and sudden movement (jolt) of the head or neck. Concussions are usually not life-threatening. Even so, the effects of a concussion can be serious. If you have had a concussion before, you are more likely to experience concussion-like symptoms after a direct blow to the head.  °CAUSES °· Direct blow to the head, such as from running into another player during a soccer game, being hit in a fight, or hitting your head on a hard surface. °· A jolt of the head or neck that causes the brain to move back and forth inside the skull, such as in a car crash. °SIGNS AND SYMPTOMS °The signs of a concussion can be hard to notice. Early on, they may be missed by you, family members, and health care providers. You may look fine but act or feel differently. °Symptoms are usually temporary, but they may last for days, weeks, or even longer. Some symptoms may appear right away while others may not show up for hours or days. Every head injury is different. Symptoms include: °· Mild to moderate headaches that will not go away. °· A feeling of pressure inside your head. °· Having more trouble than usual: °¨ Learning or remembering things you have heard. °¨ Answering questions. °¨ Paying attention or concentrating. °¨ Organizing daily tasks. °¨ Making decisions and solving problems. °· Slowness in thinking, acting or reacting, speaking, or reading. °· Getting lost or being easily confused. °· Feeling tired all the time or lacking energy (fatigued). °· Feeling drowsy. °· Sleep disturbances. °¨ Sleeping more than usual. °¨ Sleeping less than usual. °¨ Trouble falling asleep. °¨ Trouble sleeping (insomnia). °· Loss of balance or feeling lightheaded or dizzy. °· Nausea or vomiting. °· Numbness or tingling. °· Increased sensitivity to: °¨ Sounds. °¨ Lights. °¨ Distractions. °· Vision problems or eyes that tire  easily. °· Diminished sense of taste or smell. °· Ringing in the ears. °· Mood changes such as feeling sad or anxious. °· Becoming easily irritated or angry for little or no reason. °· Lack of motivation. °· Seeing or hearing things other people do not see or hear (hallucinations). °DIAGNOSIS °Your health care provider can usually diagnose a concussion based on a description of your injury and symptoms. He or she will ask whether you passed out (lost consciousness) and whether you are having trouble remembering events that happened right before and during your injury. °Your evaluation might include: °· A brain scan to look for signs of injury to the brain. Even if the test shows no injury, you may still have a concussion. °· Blood tests to be sure other problems are not present. °TREATMENT °· Concussions are usually treated in an emergency department, in urgent care, or at a clinic. You may need to stay in the hospital overnight for further treatment. °· Tell your health care provider if you are taking any medicines, including prescription medicines, over-the-counter medicines, and natural remedies. Some medicines, such as blood thinners (anticoagulants) and aspirin, may increase the chance of complications. Also tell your health care provider whether you have had alcohol or are taking illegal drugs. This information may affect treatment. °· Your health care provider will send you home with important instructions to follow. °· How fast you will recover from a concussion depends on many factors. These factors include how severe your concussion is, what part of your brain was injured,   your age, and how healthy you were before the concussion.  Most people with mild injuries recover fully. Recovery can take time. In general, recovery is slower in older persons. Also, persons who have had a concussion in the past or have other medical problems may find that it takes longer to recover from their current injury. HOME  CARE INSTRUCTIONS General Instructions  Carefully follow the directions your health care provider gave you.  Only take over-the-counter or prescription medicines for pain, discomfort, or fever as directed by your health care provider.  Take only those medicines that your health care provider has approved.  Do not drink alcohol until your health care provider says you are well enough to do so. Alcohol and certain other drugs may slow your recovery and can put you at risk of further injury.  If it is harder than usual to remember things, write them down.  If you are easily distracted, try to do one thing at a time. For example, do not try to watch TV while fixing dinner.  Talk with family members or close friends when making important decisions.  Keep all follow-up appointments. Repeated evaluation of your symptoms is recommended for your recovery.  Watch your symptoms and tell others to do the same. Complications sometimes occur after a concussion. Older adults with a brain injury may have a higher risk of serious complications, such as a blood clot on the brain.  Tell your teachers, school nurse, school counselor, coach, athletic trainer, or work Freight forwarder about your injury, symptoms, and restrictions. Tell them about what you can or cannot do. They should watch for:  Increased problems with attention or concentration.  Increased difficulty remembering or learning new information.  Increased time needed to complete tasks or assignments.  Increased irritability or decreased ability to cope with stress.  Increased symptoms.  Rest. Rest helps the brain to heal. Make sure you:  Get plenty of sleep at night. Avoid staying up late at night.  Keep the same bedtime hours on weekends and weekdays.  Rest during the day. Take daytime naps or rest breaks when you feel tired.  Limit activities that require a lot of thought or concentration. These include:  Doing homework or job-related  work.  Watching TV.  Working on the computer.  Avoid any situation where there is potential for another head injury (football, hockey, soccer, basketball, martial arts, downhill snow sports and horseback riding). Your condition will get worse every time you experience a concussion. You should avoid these activities until you are evaluated by the appropriate follow-up health care providers. Returning To Your Regular Activities You will need to return to your normal activities slowly, not all at once. You must give your body and brain enough time for recovery.  Do not return to sports or other athletic activities until your health care provider tells you it is safe to do so.  Ask your health care provider when you can drive, ride a bicycle, or operate heavy machinery. Your ability to react may be slower after a brain injury. Never do these activities if you are dizzy.  Ask your health care provider about when you can return to work or school. Preventing Another Concussion It is very important to avoid another brain injury, especially before you have recovered. In rare cases, another injury can lead to permanent brain damage, brain swelling, or death. The risk of this is greatest during the first 7-10 days after a head injury. Avoid injuries by:  Wearing a  seat belt when riding in a car.  Drinking alcohol only in moderation.  Wearing a helmet when biking, skiing, skateboarding, skating, or doing similar activities.  Avoiding activities that could lead to a second concussion, such as contact or recreational sports, until your health care provider says it is okay.  Taking safety measures in your home.  Remove clutter and tripping hazards from floors and stairways.  Use grab bars in bathrooms and handrails by stairs.  Place non-slip mats on floors and in bathtubs.  Improve lighting in dim areas. SEEK MEDICAL CARE IF:  You have increased problems paying attention or  concentrating.  You have increased difficulty remembering or learning new information.  You need more time to complete tasks or assignments than before.  You have increased irritability or decreased ability to cope with stress.  You have more symptoms than before. Seek medical care if you have any of the following symptoms for more than 2 weeks after your injury:  Lasting (chronic) headaches.  Dizziness or balance problems.  Nausea.  Vision problems.  Increased sensitivity to noise or light.  Depression or mood swings.  Anxiety or irritability.  Memory problems.  Difficulty concentrating or paying attention.  Sleep problems.  Feeling tired all the time. SEEK IMMEDIATE MEDICAL CARE IF:  You have severe or worsening headaches. These may be a sign of a blood clot in the brain.  You have weakness (even if only in one hand, leg, or part of the face).  You have numbness.  You have decreased coordination.  You vomit repeatedly.  You have increased sleepiness.  One pupil is larger than the other.  You have convulsions.  You have slurred speech.  You have increased confusion. This may be a sign of a blood clot in the brain.  You have increased restlessness, agitation, or irritability.  You are unable to recognize people or places.  You have neck pain.  It is difficult to wake you up.  You have unusual behavior changes.  You lose consciousness. MAKE SURE YOU:  Understand these instructions.  Will watch your condition.  Will get help right away if you are not doing well or get worse.   This information is not intended to replace advice given to you by your health care provider. Make sure you discuss any questions you have with your health care provider.   Document Released: 12/14/2003 Document Revised: 10/14/2014 Document Reviewed: 04/15/2013 Elsevier Interactive Patient Education 2016 [employer]  Hematoma A hematoma is a collection of blood  under the skin, in an organ, in a body space, in a joint space, or in other tissue. The blood can clot to form a lump that you can see and feel. The lump is often firm and may sometimes become sore and tender. Most hematomas get better in a few days to weeks. However, some hematomas may be serious and require medical care. Hematomas can range in size from very small to very large. CAUSES  A hematoma can be caused by a blunt or penetrating injury. It can also be caused by spontaneous leakage from a blood vessel under the skin. Spontaneous leakage from a blood vessel is more likely to occur in older people, especially those taking blood thinners. Sometimes, a hematoma can develop after certain medical procedures. SIGNS AND SYMPTOMS   A firm lump on the body.  Possible pain and tenderness in the area.  Bruising.Blue, dark blue, purple-red, or yellowish skin may appear at the site of the hematoma if  the hematoma is close to the surface of the skin. For hematomas in deeper tissues or body spaces, the signs and symptoms may be subtle. For example, an intra-abdominal hematoma may cause abdominal pain, weakness, fainting, and shortness of breath. An intracranial hematoma may cause a headache or symptoms such as weakness, trouble speaking, or a change in consciousness. DIAGNOSIS  A hematoma can usually be diagnosed based on your medical history and a physical exam. Imaging tests may be needed if your health care provider suspects a hematoma in deeper tissues or body spaces, such as the abdomen, head, or chest. These tests may include ultrasonography or a CT scan.  TREATMENT  Hematomas usually go away on their own over time. Rarely does the blood need to be drained out of the body. Large hematomas or those that may affect vital organs will sometimes need surgical drainage or monitoring. HOME CARE INSTRUCTIONS   Apply ice to the injured area:   Put ice in a plastic bag.   Place a towel between your skin  and the bag.   Leave the ice on for 20 minutes, 2-3 times a day for the first 1 to 2 days.   After the first 2 days, switch to using warm compresses on the hematoma.   Elevate the injured area to help decrease pain and swelling. Wrapping the area with an elastic bandage may also be helpful. Compression helps to reduce swelling and promotes shrinking of the hematoma. Make sure the bandage is not wrapped too tight.   If your hematoma is on a lower extremity and is painful, crutches may be helpful for a couple days.   Only take over-the-counter or prescription medicines as directed by your health care provider. SEEK IMMEDIATE MEDICAL CARE IF:   You have increasing pain, or your pain is not controlled with medicine.   You have a fever.   You have worsening swelling or discoloration.   Your skin over the hematoma breaks or starts bleeding.   Your hematoma is in your chest or abdomen and you have weakness, shortness of breath, or a change in consciousness.  Your hematoma is on your scalp (caused by a fall or injury) and you have a worsening headache or a change in alertness or consciousness. MAKE SURE YOU:   Understand these instructions.  Will watch your condition.  Will get help right away if you are not doing well or get worse.   This information is not intended to replace advice given to you by your health care provider. Make sure you discuss any questions you have with your health care provider.   Document Released: 05/07/2004 Document Revised: 05/26/2013 Document Reviewed: 03/03/2013 Elsevier Interactive Patient Education 2016 [employer]  Facial or Scalp Contusion A facial or scalp contusion is a deep bruise on the face or head. Injuries to the face and head generally cause a lot of swelling, especially around the eyes. Contusions are the result of an injury that caused bleeding under the skin. The contusion may turn blue, purple, or yellow. Minor injuries will give  you a painless contusion, but more severe contusions may stay painful and swollen for a few weeks.  CAUSES  A facial or scalp contusion is caused by a blunt injury or trauma to the face or head area.  SIGNS AND SYMPTOMS   Swelling of the injured area.   Discoloration of the injured area.   Tenderness, soreness, or pain in the injured area.  DIAGNOSIS  The diagnosis can be  made by taking a medical history and doing a physical exam. An X-ray exam, CT scan, or MRI may be needed to determine if there are any associated injuries, such as broken bones (fractures). °TREATMENT  °Often, the best treatment for a facial or scalp contusion is applying cold compresses to the injured area. Over-the-counter medicines may also be recommended for pain control.  °HOME CARE INSTRUCTIONS  °· Only take over-the-counter or prescription medicines as directed by your health care provider.   °· Apply ice to the injured area.   °¨ Put ice in a plastic bag.   °¨ Place a towel between your skin and the bag.   °¨ Leave the ice on for 20 minutes, 2-3 times a day.   °SEEK MEDICAL CARE IF: °· You have bite problems.   °· You have pain with chewing.   °· You are concerned about facial defects. °SEEK IMMEDIATE MEDICAL CARE IF: °· You have severe pain or a headache that is not relieved by medicine.   °· You have unusual sleepiness, confusion, or personality changes.   °· You throw up (vomit).   °· You have a persistent nosebleed.   °· You have double vision or blurred vision.   °· You have fluid drainage from your nose or ear.   °· You have difficulty walking or using your arms or legs.   °MAKE SURE YOU:  °· Understand these instructions. °· Will watch your condition. °· Will get help right away if you are not doing well or get worse. °  °This information is not intended to replace advice given to you by your health care provider. Make sure you discuss any questions you have with your health care provider. °  °Document Released:  10/31/2004 Document Revised: 10/14/2014 Document Reviewed: 05/06/2013 °Elsevier Interactive Patient Education ©2016 Elsevier Inc. ° °

## 2016-02-14 NOTE — ED Notes (Signed)
[facility], states that no one is available to transport patient back to their facility.

## 2016-02-14 NOTE — ED Notes (Signed)
Wound gently washed and dried. Patient couldn't tolerate procedure for very long. Caregiver at bedside.

## 2016-02-14 NOTE — ED Notes (Signed)
Caregiver present.

## 2016-02-14 NOTE — ED Notes (Signed)
Ambulance is here for transport.
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
## 2016-02-14 NOTE — ED Notes (Signed)
Caregiver sitting with pt

## 2016-05-16 ENCOUNTER — Ambulatory Visit: Payer: Self-pay | Admitting: Podiatry

## 2016-05-21 ENCOUNTER — Encounter: Payer: Self-pay | Admitting: Podiatry

## 2016-05-21 ENCOUNTER — Ambulatory Visit (INDEPENDENT_AMBULATORY_CARE_PROVIDER_SITE_OTHER): Payer: Medicare Other | Admitting: Podiatry

## 2016-05-21 DIAGNOSIS — L600 Ingrowing nail: Secondary | ICD-10-CM

## 2016-05-21 DIAGNOSIS — B351 Tinea unguium: Secondary | ICD-10-CM | POA: Diagnosis not present

## 2016-05-21 DIAGNOSIS — M79676 Pain in unspecified toe(s): Secondary | ICD-10-CM | POA: Diagnosis not present

## 2016-05-21 NOTE — Progress Notes (Signed)
   Subjective:    Patient ID: [name], female  Janann August  DOB: 03/30/1920, 82 y.o.   MRN: [id number]  HPI  82 year old female presents the office they for concerns of ingrown chance of left big toe. She denies any swelling or redness or any drainage. She has a states her nails are elongated and thickened she cannot do them herself. No other complaints at this time. Her nails do get sore with pressure in shoes.  Review of Systems  All other systems reviewed and are negative.      Objective:   Physical Exam General:  NAD  Dermatological: Has hypertrophic, dystrophic, brittle, discolored, elongated 10. There is no sign of redness or drainage. Along the left hallux how there is incurvation without any edema, erythema, drainage or pus. No clinical signs of infection today. Tenderness nails 1-5 bilaterally. No open lesions or pre-ulcerative lesions are then for this time.  Vascular: Dorsalis Pedis artery and Posterior Tibial artery pedal pulses are 2/4 bilateral with immedate capillary fill time. Pedal hair growth present.  There is no pain with calf compression, swelling, warmth, erythema.   Neruologic: Grossly intact via light touch bilateral. Vibratory intact via tuning fork bilateral. Protective threshold with Semmes Wienstein monofilament intact to all pedal sites bilateral.   Musculoskeletal: No pain, crepitus, or limitation noted with foot and ankle range of motion bilateral. Muscular strength 5/5 in all groups tested bilateral.     Assessment & Plan:  82 year old female synthetic ingrown toenail, onychomycosis. -Treatment options discussed including all alternatives, risks, and complications -Etiology of symptoms were discussed -Nails debrided 10 without complications or bleeding. -Daily foot inspection -Follow-up in 3 months or sooner if any problems arise. In the meantime, encouraged to call the office with any questions, concerns, change in symptoms.   Ovid CurdMatthew Wagoner, DPM

## 2016-08-22 ENCOUNTER — Encounter: Payer: Self-pay | Admitting: Podiatry

## 2016-08-22 ENCOUNTER — Ambulatory Visit (INDEPENDENT_AMBULATORY_CARE_PROVIDER_SITE_OTHER): Payer: Medicare Other | Admitting: Podiatry

## 2016-08-22 DIAGNOSIS — M79676 Pain in unspecified toe(s): Secondary | ICD-10-CM | POA: Diagnosis not present

## 2016-08-22 DIAGNOSIS — B351 Tinea unguium: Secondary | ICD-10-CM

## 2016-08-22 NOTE — Progress Notes (Signed)
Subjective: 82 y.o. returns the office today for painful, elongated, thickened toenails which she cannot trim herself. Denies any redness or drainage around the nails. Denies any acute changes since last appointment and no new complaints today. Denies any systemic complaints such as fevers, chills, nausea, vomiting.   Objective: AAO 3, NAD DP/PT pulses palpable, CRT less than 3 seconds Nails hypertrophic, dystrophic, elongated, brittle, discolored 10. There is tenderness overlying the nails 1-5 bilaterally. There is no surrounding erythema or drainage along the nail sites. No open lesions or pre-ulcerative lesions are identified. No other areas of tenderness bilateral lower extremities. No overlying edema, erythema, increased warmth. No pain with calf compression, swelling, warmth, erythema.  Assessment: Patient presents with symptomatic onychomycosis  Plan: -Treatment options including alternatives, risks, complications were discussed -Nails sharply debrided 10 without complication/bleeding. -Discussed daily foot inspection. If there are any changes, to call the office immediately.  -Follow-up in 3 months or sooner if any problems are to arise. In the meantime, encouraged to call the office with any questions, concerns, changes symptoms.  Ovid CurdMatthew Wagoner, DPM

## 2016-12-02 ENCOUNTER — Encounter: Payer: Self-pay | Admitting: Podiatry

## 2016-12-02 ENCOUNTER — Ambulatory Visit (INDEPENDENT_AMBULATORY_CARE_PROVIDER_SITE_OTHER): Payer: Medicare Other | Admitting: Podiatry

## 2016-12-02 DIAGNOSIS — M79676 Pain in unspecified toe(s): Secondary | ICD-10-CM | POA: Diagnosis not present

## 2016-12-02 DIAGNOSIS — B351 Tinea unguium: Secondary | ICD-10-CM

## 2016-12-02 NOTE — Progress Notes (Signed)
Complaint:  Visit Type: Patient returns to my office for continued preventative foot care services. Complaint: Patient states" my nails have grown long and thick and become painful to walk and wear shoes" . The patient presents for preventative foot care services. No changes to ROS  Podiatric Exam: Vascular: dorsalis pedis and posterior tibial pulses are palpable bilateral. Capillary return is immediate. Temperature gradient is WNL. Skin turgor WNL  Sensorium: Normal Semmes Weinstein monofilament test. Normal tactile sensation bilaterally. Nail Exam: Pt has thick disfigured discolored nails with subungual debris noted bilateral entire nail hallux through fifth toenails Ulcer Exam: There is no evidence of ulcer or pre-ulcerative changes or infection. Orthopedic Exam: Muscle tone and strength are WNL. No limitations in general ROM. No crepitus or effusions noted. Foot type and digits show no abnormalities. Bony prominences are unremarkable. Skin: No Porokeratosis. No infection or ulcers  Diagnosis:  Onychomycosis, , Pain in right toe, pain in left toes  Treatment & Plan Procedures and Treatment: Consent by patient was obtained for treatment procedures. The patient understood the discussion of treatment and procedures well. All questions were answered thoroughly reviewed. Debridement of mycotic and hypertrophic toenails, 1 through 5 bilateral and clearing of subungual debris. No ulceration, no infection noted.  Return Visit-Office Procedure: Patient instructed to return to the office for a follow up visit 3 months   for continued evaluation and treatment.    Dhwani Venkatesh DPM 

## 2017-02-11 ENCOUNTER — Emergency Department: Payer: Medicare Other

## 2017-02-11 ENCOUNTER — Encounter: Payer: Self-pay | Admitting: Emergency Medicine

## 2017-02-11 ENCOUNTER — Emergency Department
Admission: EM | Admit: 2017-02-11 | Discharge: 2017-02-11 | Disposition: A | Discharge status: Home / Self Care | Payer: Medicare Other | Attending: Emergency Medicine | Admitting: Emergency Medicine

## 2017-02-11 DIAGNOSIS — I10 Essential (primary) hypertension: Secondary | ICD-10-CM | POA: Insufficient documentation

## 2017-02-11 DIAGNOSIS — Z7982 Long term (current) use of aspirin: Secondary | ICD-10-CM | POA: Diagnosis not present

## 2017-02-11 DIAGNOSIS — Z79899 Other long term (current) drug therapy: Secondary | ICD-10-CM | POA: Insufficient documentation

## 2017-02-11 DIAGNOSIS — N390 Urinary tract infection, site not specified: Secondary | ICD-10-CM | POA: Insufficient documentation

## 2017-02-11 DIAGNOSIS — R569 Unspecified convulsions: Secondary | ICD-10-CM | POA: Diagnosis present

## 2017-02-11 DIAGNOSIS — I2510 Atherosclerotic heart disease of native coronary artery without angina pectoris: Secondary | ICD-10-CM | POA: Insufficient documentation

## 2017-02-11 DIAGNOSIS — G40909 Epilepsy, unspecified, not intractable, without status epilepticus: Secondary | ICD-10-CM | POA: Insufficient documentation

## 2017-02-11 LAB — CBC WITH DIFFERENTIAL/PLATELET
Basophils Absolute: 0.1 10*3/uL (ref 0–0.1)
Basophils Relative: 1 %
EOS ABS: 0.4 10*3/uL (ref 0–0.7)
EOS PCT: 5 %
HCT: 39.1 % (ref 35.0–47.0)
HEMOGLOBIN: 13.1 g/dL (ref 12.0–16.0)
LYMPHS ABS: 1.2 10*3/uL (ref 1.0–3.6)
Lymphocytes Relative: 13 %
MCH: 30 pg (ref 26.0–34.0)
MCHC: 33.6 g/dL (ref 32.0–36.0)
MCV: 89.4 fL (ref 80.0–100.0)
MONOS PCT: 11 %
Monocytes Absolute: 1.1 10*3/uL — ABNORMAL HIGH (ref 0.2–0.9)
Neutro Abs: 6.6 10*3/uL — ABNORMAL HIGH (ref 1.4–6.5)
Neutrophils Relative %: 70 %
PLATELETS: 167 10*3/uL (ref 150–440)
RBC: 4.37 MIL/uL (ref 3.80–5.20)
RDW: 14.5 % (ref 11.5–14.5)
WBC: 9.3 10*3/uL (ref 3.6–11.0)

## 2017-02-11 LAB — BASIC METABOLIC PANEL
Anion gap: 6 (ref 5–15)
BUN: 37 mg/dL — AB (ref 6–20)
CHLORIDE: 109 mmol/L (ref 101–111)
CO2: 25 mmol/L (ref 22–32)
CREATININE: 1.79 mg/dL — AB (ref 0.44–1.00)
Calcium: 9.1 mg/dL (ref 8.9–10.3)
GFR calc Af Amer: 27 mL/min — ABNORMAL LOW (ref >60)
GFR calc non Af Amer: 23 mL/min — ABNORMAL LOW (ref >60)
Glucose, Bld: 96 mg/dL (ref 65–99)
Potassium: 5.2 mmol/L — ABNORMAL HIGH (ref 3.5–5.1)
SODIUM: 140 mmol/L (ref 135–145)

## 2017-02-11 LAB — URINALYSIS, COMPLETE (UACMP) WITH MICROSCOPIC
Bilirubin Urine: NEGATIVE
Glucose, UA: NEGATIVE mg/dL
Hgb urine dipstick: NEGATIVE
KETONES UR: NEGATIVE mg/dL
Nitrite: POSITIVE — AB
PH: 5 (ref 5.0–8.0)
PROTEIN: 30 mg/dL — AB
Specific Gravity, Urine: 1.015 (ref 1.005–1.030)

## 2017-02-11 LAB — LACTIC ACID, PLASMA: LACTIC ACID, VENOUS: 1.7 mmol/L (ref 0.5–1.9)

## 2017-02-11 LAB — TROPONIN I

## 2017-02-11 MED ORDER — CEPHALEXIN 500 MG PO CAPS: 500.0000 mg | ORAL_CAPSULE | Freq: Three times a day (TID) | ORAL | 0 refills | Status: DC

## 2017-02-11 MED ORDER — SODIUM CHLORIDE 0.9 % IV BOLUS (SEPSIS)
500.0000 mL | Freq: Once | INTRAVENOUS | Status: AC
  Administered 2017-02-11: 500 mL via INTRAVENOUS

## 2017-02-11 MED ORDER — CEFTRIAXONE SODIUM IN DEXTROSE 20 MG/ML IV SOLN
INTRAVENOUS | Status: AC
  Administered 2017-02-11: 1 g via INTRAVENOUS
  Filled 2017-02-11: qty 50

## 2017-02-11 MED ORDER — CEFTRIAXONE SODIUM IN DEXTROSE 20 MG/ML IV SOLN
1.0000 g | Freq: Once | INTRAVENOUS | Status: AC
  Administered 2017-02-11: 1 g via INTRAVENOUS
  Filled 2017-02-11: qty 50

## 2017-02-11 NOTE — ED Triage Notes (Signed)
Pt to ED via EMS from [facility] c/o seizure like activity, and decreased responsiveness.  Per EMS facility stated patient stopped talking to them and was slumped over upon their arrival.  Per facility patient's baseline is A&O and talking.  Pt presents with right sided gaze, moving extremities x4, non verbal, occasional groans.  EMS vitals 140/88 BP, 70 HR, 135 CBG.  Pt has DNR brought with her.

## 2017-02-11 NOTE — ED Notes (Signed)
Took patient to bathroom and hooked her back up to monitor.

## 2017-02-11 NOTE — ED Provider Notes (Signed)
[facility] Emergency Department Provider Note   ____________________________________________   I have reviewed the triage vital signs and the nursing notes.   HISTORY  Chief Complaint Seizures   History limited by: AMS/Post ictal? - some history obtained from caregiver   HPI [name] is a 83 y.o. female who presents to the emergency department today is of concerns for decreased level of alertness. Per the caregiver she was with the patient this morning. She took her to go outside to the son. Once the patient got out there the patient that she was asleep however was not able to arouse her. She stated that when she was trying to wake her up she did notice her hand twitching. The patient herself is awake on my examination. She is able to tell me her name however not the events of today the date or where she is. The caregiver states patient has not had any recent illness to her knowledge.   Past Medical History:  Diagnosis Date  . Anxiety   . Coronary artery disease   . Dehydration   . Glaucoma   . Hypertension   . Macular degeneration   . Memory change   . Seizures [facility])     Patient Active Problem List   Diagnosis Date Noted  . UTI (urinary tract infection) 06/06/2015  . Acute encephalopathy 06/04/2015  . Sepsis due to pneumonia (HCC) 06/04/2015  . Diarrhea 06/04/2015  . HTN (hypertension) 06/04/2015    History reviewed. No pertinent surgical history.  Prior to Admission medications   Medication Sig Start Date End Date Taking? Authorizing Provider  acetaminophen (TYLENOL) 500 MG tablet Take 500 mg by mouth every 6 (six) hours as needed for mild pain, moderate pain, fever or headache.   Yes [provider]  aspirin 81 MG chewable tablet Chew 81 mg by mouth daily.   Yes [provider]  bimatoprost (LUMIGAN) 0.01 % SOLN Place 1 drop into the right eye at bedtime.   Yes [provider]  brimonidine-timolol (COMBIGAN)  0.2-0.5 % ophthalmic solution Apply 1 drop to eye 2 (two) times daily. Apply to right eye   Yes [provider]  busPIRone (BUSPAR) 5 MG tablet Take 5 mg by mouth 2 (two) times daily.   Yes [provider]  Dimethicone (BAZA CLEANSE & PROTECT EX) Apply 1 application topically 3 (three) times daily as needed.   Yes [provider]  docusate sodium (COLACE) 100 MG capsule Take 100 mg by mouth 2 (two) times daily as needed for mild constipation or moderate constipation.   Yes [provider]  guaiFENesin-dextromethorphan (ROBITUSSIN DM) 100-10 MG/5ML syrup Take 5 mLs by mouth every 6 (six) hours as needed for cough. For 7-10 days 02/13/16  Yes [provider]  hydrALAZINE (APRESOLINE) 10 MG tablet Take 10 mg by mouth 4 (four) times daily.   Yes [provider]  hydroxypropyl methylcellulose / hypromellose (ISOPTO TEARS / GONIOVISC) 2.5 % ophthalmic solution Place 1 drop into both eyes 2 (two) times daily as needed for dry eyes.   Yes [provider]  levETIRAcetam (KEPPRA) 500 MG tablet Take 500 mg by mouth 2 (two) times daily.   Yes [provider]  Multiple Vitamins-Minerals (PRESERVISION AREDS) TABS Take 1 tablet by mouth 2 (two) times daily.   Yes [provider]  naproxen sodium (ANAPROX) 220 MG tablet Take 440 mg by mouth 2 (two) times daily with a meal.    Yes [provider]  nystatin ointment (MYCOSTATIN) Apply 1 application topically 2 (two) times daily as needed (for rash/irritated skin area). Apply to groin and inner thighs   Yes [provider]  tolterodine (DETROL) 1 MG tablet Take 1 mg by mouth every other day.   Yes [provider]    Allergies Patient has no known allergies.  Family History  Problem Relation Age of Onset  . Family history unknown: Yes    Social History Social History  Substance Use Topics  . Smoking status: Never Smoker  . Smokeless tobacco: Never Used  .  Alcohol use No    Review of Systems Unreliable given patient's AMS  ____________________________________________   PHYSICAL EXAM:  VITAL SIGNS: ED Triage Vitals [02/11/17 1044]  Enc Vitals Group     BP 130/85     Pulse Rate 64     Resp 20     Temp (!) 96.3 F (35.7 C)     Temp Source Axillary     SpO2 100 %     Weight 141 lb 3.2 oz (64 kg)     Height 5\' 6"  (1.676 m)   Constitutional: Awake and alert. Well appearing and in no distress. Eyes: Conjunctivae are normal. Normal extraocular movements. ENT   Head: Normocephalic and atraumatic.   Nose: No congestion/rhinnorhea.   Mouth/Throat: Mucous membranes are moist.   Neck: No stridor. Hematological/Lymphatic/Immunilogical: No cervical lymphadenopathy. Cardiovascular: Normal rate, regular rhythm.  No murmurs, rubs, or gallops.  Respiratory: Normal respiratory effort without tachypnea nor retractions. Breath sounds are clear and equal bilaterally. No wheezes/rales/rhonchi. Gastrointestinal: Soft and non tender. No rebound. No guarding.  Genitourinary: Deferred Musculoskeletal: Normal range of motion in all extremities. No lower extremity edema. Neurologic:  Normal speech and language. Not oriented to time, place or events. Appears to move all extremities. Sensation grossly intact.  Skin:  Skin is warm, dry and intact. No rash noted. Psychiatric: Mood and affect are normal. Speech and behavior are normal. Patient exhibits appropriate insight and judgment.  ____________________________________________    LABS (pertinent positives/negatives)  Labs Reviewed  CBC WITH DIFFERENTIAL/PLATELET - Abnormal; Notable for the following:       Result Value   Neutro Abs 6.6 (*)    Monocytes Absolute 1.1 (*)    All other components within normal limits  BASIC METABOLIC PANEL - Abnormal; Notable for the following:    Potassium 5.2 (*)    BUN 37 (*)    Creatinine, Ser 1.79 (*)    GFR calc non Af Amer 23 (*)    GFR calc Af  Amer 27 (*)    All other components within normal limits  URINALYSIS, COMPLETE (UACMP) WITH MICROSCOPIC - Abnormal; Notable for the following:    Color, Urine AMBER (*)    APPearance TURBID (*)    Protein, ur 30 (*)    Nitrite POSITIVE (*)    Leukocytes, UA LARGE (*)    Bacteria, UA MANY (*)    Squamous Epithelial / LPF 0-5 (*)    All other components within normal limits  URINE CULTURE  TROPONIN I  LACTIC ACID, PLASMA  LEVETIRACETAM LEVEL     ____________________________________________   EKG  I, Phineas SemenGraydon Graceanna Theissen, attending physician, personally viewed and interpreted this EKG  EKG Time: 1110 Rate: 68 Rhythm: sinus rhythm Axis: left axis deviation Intervals: qtc 416 QRS: LVH ST changes: No st elevaion, T wave inversion I, V1, V2 Impression: abnormal ekg   ____________________________________________    RADIOLOGY  CT head  IMPRESSION: No  acute intracranial abnormality.  Atrophy, chronic microvascular disease.  ____________________________________________   PROCEDURES  Procedures  ____________________________________________   INITIAL IMPRESSION / ASSESSMENT AND PLAN / ED COURSE  Pertinent labs & imaging results that were available during my care of the patient were reviewed by me and considered in my medical decision making (see chart for details).  Patient presented to the emergency department today after a possible seizure. Patient does have a seizure history. She is on Keppra. The patient initially was somewhat somnolent and confused. She continued be a slightly confused however much more awake throughout her stay in the emergency department. 7 history of some dementia. Patient's urine is concerning for urinary tract infection and I do wonder if this triggered the patient's episode. Will plan on giving patient does have IV antibiotics here in emergency department and continuing treatment at home. Per latest urine that was cultured she should be  sensitive to cephalosporin. Will send urine for culture today. ____________________________________________   FINAL CLINICAL IMPRESSION(S) / ED DIAGNOSES  Final diagnoses:  Seizure-like activity (HCC)  Lower urinary tract infectious disease     Note: This dictation was prepared with Dragon dictation. Any transcriptional errors that result from this process are unintentional     Phineas Semen, MD 02/11/17 1441

## 2017-02-11 NOTE — Discharge Instructions (Signed)
Please seek medical attention for any high fevers, chest pain, shortness of breath, change in behavior, persistent vomiting, bloody stool or any other new or concerning symptoms.  

## 2017-02-11 NOTE — ED Notes (Signed)
Patient's discharge and follow up information reviewed with patient by ED nursing staff and patient given the opportunity to ask questions pertaining to ED visit and discharge plan of care. Patient advised that should symptoms not continue to improve, resolve entirely, or should new symptoms develop then a follow up visit with their PCP or a return visit to the ED may be warranted. Patient verbalized consent and understanding of discharge plan of care including potential need for further evaluation. Patient discharged in stable condition per attending ED physician on duty.   Pt transported to lobby via WC. Personal sitter Scientist, research (medical)Jennifer at pt's side to [facility]. Sitter stated facility is sending a wheelchair van to pick pt up. Spoke to nurse Georgiann HahnKat at [facility] and gave brief report and notified of pt's discharge.

## 2017-02-13 LAB — LEVETIRACETAM LEVEL: LEVETIRACETAM: 29.5 ug/mL (ref 10.0–40.0)

## 2017-02-13 LAB — URINE CULTURE: Culture: >=100000 — AB

## 2017-03-10 ENCOUNTER — Encounter: Payer: Self-pay | Admitting: Podiatry

## 2017-03-10 ENCOUNTER — Ambulatory Visit (INDEPENDENT_AMBULATORY_CARE_PROVIDER_SITE_OTHER): Payer: Medicare Other | Admitting: Podiatry

## 2017-03-10 VITALS — BP 145/87 | HR 49

## 2017-03-10 DIAGNOSIS — B351 Tinea unguium: Secondary | ICD-10-CM

## 2017-03-10 DIAGNOSIS — M79676 Pain in unspecified toe(s): Secondary | ICD-10-CM

## 2017-03-10 NOTE — Progress Notes (Signed)
Complaint:  Visit Type: Patient returns to my office for continued preventative foot care services. Complaint: Patient states" my nails have grown long and thick and become painful to walk and wear shoes"  The patient presents for preventative foot care services. No changes to ROS  Podiatric Exam: Vascular: dorsalis pedis and posterior tibial pulses are palpable bilateral. Capillary return is immediate. Temperature gradient is WNL. Skin turgor WNL  Sensorium: Normal Semmes Weinstein monofilament test. Normal tactile sensation bilaterally. Nail Exam: Pt has thick disfigured discolored nails with subungual debris noted bilateral entire nail hallux through fifth toenails Ulcer Exam: There is no evidence of ulcer or pre-ulcerative changes or infection. Orthopedic Exam: Muscle tone and strength are WNL. No limitations in general ROM. No crepitus or effusions noted. Foot type and digits show no abnormalities. Bony prominences are unremarkable. Swelling feet  B/L Skin: No Porokeratosis. No infection or ulcers  Diagnosis:  Onychomycosis, , Pain in right toe, pain in left toes  Treatment & Plan Procedures and Treatment: Consent by patient was obtained for treatment procedures. The patient understood the discussion of treatment and procedures well. All questions were answered thoroughly reviewed. Debridement of mycotic and hypertrophic toenails, 1 through 5 bilateral and clearing of subungual debris. No ulceration, no infection noted.  Return Visit-Office Procedure: Patient instructed to return to the office for a follow up visit 3 months for continued evaluation and treatment.    Kathrin Folden DPM 

## 2017-06-12 ENCOUNTER — Encounter: Payer: Self-pay | Admitting: Podiatry

## 2017-06-12 ENCOUNTER — Ambulatory Visit (INDEPENDENT_AMBULATORY_CARE_PROVIDER_SITE_OTHER): Payer: Medicare Other | Admitting: Podiatry

## 2017-06-12 DIAGNOSIS — M79675 Pain in left toe(s): Secondary | ICD-10-CM | POA: Diagnosis not present

## 2017-06-12 DIAGNOSIS — B351 Tinea unguium: Secondary | ICD-10-CM

## 2017-06-12 DIAGNOSIS — M79674 Pain in right toe(s): Secondary | ICD-10-CM

## 2017-06-12 NOTE — Progress Notes (Signed)
Complaint:  Visit Type: Patient returns to my office for continued preventative foot care services. Complaint: Patient states" my nails have grown long and thick and become painful to walk and wear shoes"  The patient presents for preventative foot care services. No changes to ROS  Podiatric Exam: Vascular: dorsalis pedis and posterior tibial pulses are palpable bilateral. Capillary return is immediate. Temperature gradient is WNL. Skin turgor WNL  Sensorium: Normal Semmes Weinstein monofilament test. Normal tactile sensation bilaterally. Nail Exam: Pt has thick disfigured discolored nails with subungual debris noted bilateral entire nail hallux through fifth toenails Ulcer Exam: There is no evidence of ulcer or pre-ulcerative changes or infection. Orthopedic Exam: Muscle tone and strength are WNL. No limitations in general ROM. No crepitus or effusions noted. Foot type and digits show no abnormalities. Bony prominences are unremarkable. Swelling feet  B/L Skin: No Porokeratosis. No infection or ulcers  Diagnosis:  Onychomycosis, , Pain in right toe, pain in left toes  Treatment & Plan Procedures and Treatment: Consent by patient was obtained for treatment procedures. The patient understood the discussion of treatment and procedures well. All questions were answered thoroughly reviewed. Debridement of mycotic and hypertrophic toenails, 1 through 5 bilateral and clearing of subungual debris. No ulceration, no infection noted.  Return Visit-Office Procedure: Patient instructed to return to the office for a follow up visit 3 months for continued evaluation and treatment.    Helane GuntherGregory Sherry Blackard DPM

## 2017-09-11 ENCOUNTER — Encounter: Payer: Self-pay | Admitting: Podiatry

## 2017-09-11 ENCOUNTER — Ambulatory Visit (INDEPENDENT_AMBULATORY_CARE_PROVIDER_SITE_OTHER): Payer: Medicare Other | Admitting: Podiatry

## 2017-09-11 DIAGNOSIS — M79674 Pain in right toe(s): Secondary | ICD-10-CM | POA: Diagnosis not present

## 2017-09-11 DIAGNOSIS — M79675 Pain in left toe(s): Secondary | ICD-10-CM

## 2017-09-11 DIAGNOSIS — B351 Tinea unguium: Secondary | ICD-10-CM | POA: Diagnosis not present

## 2017-09-11 NOTE — Progress Notes (Signed)
Complaint:  Visit Type: Patient returns to my office for continued preventative foot care services. Complaint: Patient states" my nails have grown long and thick and become painful to walk and wear shoes"  The patient presents for preventative foot care services. No changes to ROS  Podiatric Exam: Vascular: dorsalis pedis and posterior tibial pulses are palpable bilateral. Capillary return is immediate. Temperature gradient is WNL. Skin turgor WNL  Sensorium: Normal Semmes Weinstein monofilament test. Normal tactile sensation bilaterally. Nail Exam: Pt has thick disfigured discolored nails with subungual debris noted bilateral entire nail hallux through fifth toenails Ulcer Exam: There is no evidence of ulcer or pre-ulcerative changes or infection. Orthopedic Exam: Muscle tone and strength are WNL. No limitations in general ROM. No crepitus or effusions noted. Foot type and digits show no abnormalities. Bony prominences are unremarkable. Swelling feet  B/L Skin: No Porokeratosis. No infection or ulcers  Diagnosis:  Onychomycosis, , Pain in right toe, pain in left toes  Treatment & Plan Procedures and Treatment: Consent by patient was obtained for treatment procedures. The patient understood the discussion of treatment and procedures well. All questions were answered thoroughly reviewed. Debridement of mycotic and hypertrophic toenails, 1 through 5 bilateral and clearing of subungual debris. No ulceration, no infection noted.  Return Visit-Office Procedure: Patient instructed to return to the office for a follow up visit 4 months for continued evaluation and treatment.    Helane GuntherGregory Enrico Eaddy DPM

## 2017-09-12 ENCOUNTER — Ambulatory Visit
Admission: RE | Admit: 2017-09-12 | Discharge: 2017-09-12 | Disposition: A | Discharge status: Home / Self Care | Payer: Medicare Other | Source: Ambulatory Visit | Attending: Internal Medicine | Admitting: Internal Medicine

## 2017-09-12 ENCOUNTER — Other Ambulatory Visit: Payer: Self-pay | Admitting: Internal Medicine

## 2017-09-12 DIAGNOSIS — R059 Cough, unspecified: Secondary | ICD-10-CM

## 2017-09-12 DIAGNOSIS — I700 Atherosclerosis of aorta: Secondary | ICD-10-CM | POA: Diagnosis not present

## 2017-09-12 DIAGNOSIS — R05 Cough: Secondary | ICD-10-CM

## 2017-11-13 ENCOUNTER — Encounter: Payer: Medicare Other | Attending: Nurse Practitioner | Admitting: Nurse Practitioner

## 2017-11-13 DIAGNOSIS — I739 Peripheral vascular disease, unspecified: Secondary | ICD-10-CM | POA: Diagnosis not present

## 2017-11-13 DIAGNOSIS — I10 Essential (primary) hypertension: Secondary | ICD-10-CM | POA: Insufficient documentation

## 2017-11-13 DIAGNOSIS — E785 Hyperlipidemia, unspecified: Secondary | ICD-10-CM | POA: Diagnosis not present

## 2017-11-13 DIAGNOSIS — S80211A Abrasion, right knee, initial encounter: Secondary | ICD-10-CM | POA: Diagnosis present

## 2017-11-13 DIAGNOSIS — I4891 Unspecified atrial fibrillation: Secondary | ICD-10-CM | POA: Diagnosis not present

## 2017-11-13 DIAGNOSIS — I2510 Atherosclerotic heart disease of native coronary artery without angina pectoris: Secondary | ICD-10-CM | POA: Diagnosis not present

## 2017-11-13 DIAGNOSIS — W19XXXA Unspecified fall, initial encounter: Secondary | ICD-10-CM | POA: Diagnosis not present

## 2017-11-13 DIAGNOSIS — F0390 Unspecified dementia without behavioral disturbance: Secondary | ICD-10-CM | POA: Insufficient documentation

## 2017-11-14 NOTE — Progress Notes (Signed)
[name], [name] ([id number]) Visit Report for 11/13/2017 Chief Complaint Document Details Patient Name: [name], [name]. Date of Service: 11/13/2017 10:30 AM Medical Record Number: [id number] Patient Account Number: [id number] Date of Birth/Sex: Jul 01, 1920 (84 y.o. Female) Treating RN: Phillis Haggis Primary Care Provider: Dewaine Oats Other Clinician: Referring Provider: Dewaine Oats Treating Provider/Extender: Kathreen Cosier in Treatment: 0 Information Obtained from: Patient Chief Complaint She is here for evaluation of bilateral knee wounds Electronic Signature(s) Signed: 11/13/2017 11:55:42 AM By: Bonnell Public Entered By: Bonnell Public on 11/13/2017 11:55:41 [name], [name] -------------------------------------------------------------------------------- HPI Details Patient Name: [name]. Date of Service: 11/13/2017 10:30 AM Medical Record Number: [id number] Patient Account Number: [id number] Date of Birth/Sex: Nov 28, 1919 (84 y.o. Female) Treating RN: Phillis Haggis Primary Care Provider: TATE, [facility] Other Clinician: Referring Provider: Dewaine Oats Treating Provider/Extender: Kathreen Cosier in Treatment: 0 History of Present Illness Location: bilateral knees Quality: pt unable to qualify r/t dementia Severity: pt unable to quantify r/t dementia Duration: approximately one month (January 2018) Context: s/p fall, traumatic injury HPI Description: 11/13/17 patient is here for initial evaluation for bilateral knee traumatic injury. Information is obtained from outside medical records and personal companion. She sustained a fall 3-4 weeks ago, likely slipped while transferring from her wheelchair to the commode and landed on her knees; she has been less mobile since then per companion. According to the companion there has been no topical treatment that she is aware of, although she is not responsible for bathing, dressing, performing/assisting ADLs. Patient  resides in [facility] assisted living, does not have home health, has private duty companionship from [facility] 8 hours a day (8-12:30 and 13:30-17:30). Patient is unable to provide any specific information or details. She last saw primary care yesterday where she was referred to walk-in clinic for x-ray secondary to persistent pain and referred to wound care center Electronic Signature(s) Signed: 11/13/2017 1:11:48 PM By: Bonnell Public Entered By: Bonnell Public on 11/13/2017 13:11:48 [name], [name] ([id number]) -------------------------------------------------------------------------------- Physical Exam Details Patient Name: [name]. Date of Service: 11/13/2017 10:30 AM Medical Record Number: [id number] Patient Account Number: [id number] Date of Birth/Sex: 1919-12-07 (84 y.o. Female) Treating RN: Phillis Haggis Primary Care Provider: Dewaine Oats Other Clinician: Referring Provider: Dewaine Oats Treating Provider/Extender: Bonnell Public Weeks in Treatment: 0 Respiratory non-labored. clear to auscultation. Cardiovascular s1 s2 regular, rate controlled. BLE edema, warm to touch, no cyanosis, no ruburous changes, multiphasic doppler signals DP. Musculoskeletal assist from wheelchair to exam chair. Integumentary (Hair, Skin) right knee with multiple clusters areas of partial thickness tissue loss; left knee with large densely adherent scab, no active drainage; no signs of infection to either. Psychiatric does not have good insight or judgement to medical needs. not oriented to time, place, situation. Electronic Signature(s) Signed: 11/13/2017 1:12:04 PM By: Bonnell Public Previous Signature: 11/13/2017 11:58:15 AM Version By: Bonnell Public Entered By: Bonnell Public on 11/13/2017 13:12:04 [name], [name] ([id number]) -------------------------------------------------------------------------------- Physician Orders Details Patient Name: [name]. Date of Service: 11/13/2017 10:30  AM Medical Record Number: [id number] Patient Account Number: [id number] Date of Birth/Sex: Aug 13, 1920 (84 y.o. Female) Treating RN: Ashok Cordia, Debi Primary Care Provider: Dewaine Oats Other Clinician: Referring Provider: Dewaine Oats Treating Provider/Extender: Kathreen Cosier in Treatment: 0 Verbal / Phone Orders: Yes Clinician: Pinkerton, Debi Read Back and Verified: Yes Diagnosis Coding Wound Cleansing Wound #1 Left Knee o Clean wound with Normal Saline. o Cleanse wound with mild soap and water o May Shower, gently pat wound dry prior  to applying new dressing. Wound #2 Right Knee o Clean wound with Normal Saline. o Cleanse wound with mild soap and water o May Shower, gently pat wound dry prior to applying new dressing. Anesthetic (add to Medication List) Wound #1 Left Knee o Topical Lidocaine 4% cream applied to wound bed prior to debridement (In Clinic Only). o Hurricaine Topical Anesthetic Spray applied to wound bed prior to debridement (In Clinic Only). Wound #2 Right Knee o Topical Lidocaine 4% cream applied to wound bed prior to debridement (In Clinic Only). o Hurricaine Topical Anesthetic Spray applied to wound bed prior to debridement (In Clinic Only). Skin Barriers/Peri-Wound Care Wound #1 Left Knee o Skin Prep Wound #2 Right Knee o Skin Prep Primary Wound Dressing Wound #1 Left Knee o Hydrogel Secondary Dressing Wound #1 Left Knee o Boardered Foam Dressing Wound #2 Right Knee o Boardered Foam Dressing Dressing Change Frequency Wound #1 Left Knee o Change dressing every day. Wound #2 Right Knee Joanna PuffBLACK, Jeanette P. ([id number]) o Change dressing every day. Follow-up Appointments Wound #1 Left Knee o Return Appointment in 1 week. Wound #2 Right Knee o Return Appointment in 1 week. Edema Control o Other: - ace wraps to bilateral legs for the edema and change the daily to access legs and wash Off-Loading Wound #1 Left  Knee o Turn and reposition every 2 hours Wound #2 Right Knee o Turn and reposition every 2 hours Additional Orders / Instructions Wound #1 Left Knee o Increase protein intake. o Other: - vitamin C, zinc Wound #2 Right Knee o Increase protein intake. o Other: - vitamin C, zinc Patient Medications Allergies: NKDA Notifications Medication Indication Start End lidocaine DOSE 1 - topical 4 % cream - 1 cream topical Electronic Signature(s) Signed: 11/13/2017 3:49:02 PM By: Bonnell Publicoulter, Tymika Grilli Entered By: Bonnell Publicoulter, Shuaib Corsino on 11/13/2017 13:12:16 [name], [name] ([id number]) -------------------------------------------------------------------------------- Prescription 11/13/2017 Patient Name: Joanna PuffBLACK, Jeanette P. Provider: Bonnell Publicoulter, Jaquay Morneault NP Date of Birth: 04/23/1920 NPI#: [id number] Sex: F DEA#: [id number] Phone #: [phone] License #: Patient Address: [facility] [address] [facility] [address], [address] [place], [place] [address] [place], [place] [address] [phone] Allergies NKDA Medication Medication: Route: Strength: Form: lidocaine 4 % topical cream topical 4% cream Class: TOPICAL LOCAL ANESTHETICS Dose: Frequency / Time: Indication: 1 1 cream topical Number of Refills: Number of Units: 0 Generic Substitution: Start Date: End Date: One Time Use: Substitution Permitted No Note to Pharmacy: Signature(s): Date(s): Electronic Signature(s) Signed: 11/13/2017 3:49:02 PM By: Bonnell Publicoulter, Fergus Throne Entered By: Bonnell Publicoulter, Salmaan Patchin on 11/13/2017 13:12:16 Jaggi, [name] ([id number]) --------------------------------------------------------------------------------  Problem List Details Patient Name: [name], Jeanette P. Date of Service: 11/13/2017 10:30 AM Medical Record Number: [id number] Patient Account Number: [id number] Date of Birth/Sex: 02/19/1920 (84 y.o. Female) Treating RN: Ashok CordiaPinkerton, Debi Primary  Care Provider: Dewaine OatsATE, DENNY Other Clinician: Referring Provider: Dewaine OatsATE, DENNY Treating Provider/Extender: Kathreen Cosieroulter, Ludia Gartland Weeks in Treatment: 0 Active Problems ICD-10 Encounter Code Description Active Date Diagnosis S80.211S Abrasion, right knee, sequela 11/13/2017 Yes S80.212S Abrasion, left knee, sequela 11/13/2017 Yes R54 Age-related physical debility 11/13/2017 Yes I48.91 Unspecified atrial fibrillation 11/13/2017 Yes E78.5 Hyperlipidemia, unspecified 11/13/2017 Yes F03.90 Unspecified dementia without behavioral disturbance 11/13/2017 Yes Inactive Problems Resolved Problems Electronic Signature(s) Signed: 11/13/2017 1:14:42 PM By: Bonnell Publicoulter, Darris Staiger Previous Signature: 11/13/2017 11:55:10 AM Version By: Bonnell Publicoulter, Vaibhav Fogleman Entered By: Bonnell Publicoulter, Princella Jaskiewicz on 11/13/2017 13:14:42 Lasecki, [name] ([id number]) -------------------------------------------------------------------------------- Progress Note Details Patient Name: [name], Jeanette P. Date of Service: 11/13/2017 10:30 AM Medical Record  Number: [id number] Patient Account Number: [id number] Date of Birth/Sex: Jun 12, 1920 (84 y.o. Female) Treating RN: Ashok Cordia, Debi Primary Care Provider: TATE, [facility] Other Clinician: Referring Provider: Dewaine Oats Treating Provider/Extender: Kathreen Cosier in Treatment: 0 Subjective Chief Complaint Information obtained from Patient She is here for evaluation of bilateral knee wounds History of Present Illness (HPI) The following HPI elements were documented for the patient's wound: Location: bilateral knees Quality: pt unable to qualify r/t dementia Severity: pt unable to quantify r/t dementia Duration: approximately one month (January 2018) Context: s/p fall, traumatic injury 11/13/17 patient is here for initial evaluation for bilateral knee traumatic injury. Information is obtained from outside medical records and personal companion. She sustained a fall 3-4 weeks ago, likely slipped while transferring from her  wheelchair to the commode and landed on her knees; she has been less mobile since then per companion. According to the companion there has been no topical treatment that she is aware of, although she is not responsible for bathing, dressing, performing/assisting ADLs. Patient resides in [facility] assisted living, does not have home health, has private duty companionship from [facility] 8 hours a day (8-12:30 and 13:30-17:30). Patient is unable to provide any specific information or details. She last saw primary care yesterday where she was referred to walk-in clinic for x-ray secondary to persistent pain and referred to wound care center Wound History Patient presents with 2 open wounds that have been present for approximately 3 weeks. Patient has been treating wounds in the following manner: unknown. Laboratory tests have not been performed in the last month. Patient reportedly has not tested positive for an antibiotic resistant organism. Patient reportedly has not tested positive for osteomyelitis. Patient reportedly has not had testing performed to evaluate circulation in the legs. Patient History Unable to Obtain Patient History due to Altered Mental Status. Information obtained from Caregiver. Allergies NKDA Social History Marital Status - Widowed, Alcohol Use - Never, Drug Use - No History, Caffeine Use - Daily. Medical History Cardiovascular Patient has history of Arrhythmia - a-fib, Coronary Artery Disease, Hypertension Neurologic Patient has history of Dementia [name], Jeanette P. ([id number]) Review of Systems (ROS) Constitutional Symptoms (General Health) The patient has no complaints or symptoms. Eyes Complains or has symptoms of Glasses / Contacts. Ear/Nose/Mouth/Throat The patient has no complaints or symptoms. Hematologic/Lymphatic The patient has no complaints or symptoms. Respiratory The patient has no complaints or  symptoms. Cardiovascular hyperlidemia Gastrointestinal The patient has no complaints or symptoms. Endocrine The patient has no complaints or symptoms. Genitourinary Complains or has symptoms of Incontinence/dribbling. Immunological The patient has no complaints or symptoms. Integumentary (Skin) Complains or has symptoms of Wounds. Musculoskeletal The patient has no complaints or symptoms. Oncologic The patient has no complaints or symptoms. Psychiatric Complains or has symptoms of Anxiety, depression General Notes: unable to get family hx, living will, medical power of attorney , advanced directive, smoking history d/t pt having altered mental status Objective Constitutional Vitals Time Taken: 10:23 AM, Temperature: 97.6 F, Pulse: 64 bpm, Respiratory Rate: 16 breaths/min, Blood Pressure: 127/57 mmHg. Respiratory non-labored. clear to auscultation. Cardiovascular s1 s2 regular, rate controlled. BLE edema, warm to touch, no cyanosis, no ruburous changes, multiphasic doppler signals DP. Musculoskeletal assist from wheelchair to exam chair. Psychiatric does not have good insight or judgement to medical needs. not oriented to time, place, situation. [name], [name] Integumentary (Hair, Skin) right knee with multiple clusters areas of partial thickness tissue loss; left knee with large densely adherent scab, no active drainage;  no signs of infection to either. Wound #1 status is Open. Original cause of wound was Trauma. The wound is located on the Left Knee. The wound measures 2.5cm length x 1cm width x 0.1cm depth; 1.963cm^2 area and 0.196cm^3 volume. There is no tunneling or undermining noted. There is a large amount of serosanguineous drainage noted. The wound margin is distinct with the outline attached to the wound base. There is small (1-33%) red granulation within the wound bed. There is a large (67-100%) amount of necrotic tissue within the wound bed  including Eschar. Periwound temperature was noted as No Abnormality. The periwound has tenderness on palpation. Wound #2 status is Open. Original cause of wound was Trauma. The wound is located on the Right Knee. The wound measures 0.7cm length x 0.9cm width x 0.1cm depth; 0.495cm^2 area and 0.049cm^3 volume. There is no tunneling or undermining noted. There is a large amount of serosanguineous drainage noted. The wound margin is distinct with the outline attached to the wound base. There is large (67-100%) red granulation within the wound bed. There is no necrotic tissue within the wound bed. Periwound temperature was noted as No Abnormality. The periwound has tenderness on palpation. Assessment Active Problems ICD-10 S80.211S - Abrasion, right knee, sequela S80.212S - Abrasion, left knee, sequela R54 - Age-related physical debility I48.91 - Unspecified atrial fibrillation E78.5 - Hyperlipidemia, unspecified F03.90 - Unspecified dementia without behavioral disturbance Plan Wound Cleansing: Wound #1 Left Knee: Clean wound with Normal Saline. Cleanse wound with mild soap and water May Shower, gently pat wound dry prior to applying new dressing. Wound #2 Right Knee: Clean wound with Normal Saline. Cleanse wound with mild soap and water May Shower, gently pat wound dry prior to applying new dressing. Anesthetic (add to Medication List): Wound #1 Left Knee: Topical Lidocaine 4% cream applied to wound bed prior to debridement (In Clinic Only). Hurricaine Topical Anesthetic Spray applied to wound bed prior to debridement (In Clinic Only). Wound #2 Right Knee: Topical Lidocaine 4% cream applied to wound bed prior to debridement (In Clinic Only). Hurricaine Topical Anesthetic Spray applied to wound bed prior to debridement (In Clinic Only). Skin Barriers/Peri-Wound Care: Wound #1 Left Knee: [name], [name] ([id number]) Skin Prep Wound #2 Right Knee: Skin Prep Primary Wound  Dressing: Wound #1 Left Knee: Hydrogel Secondary Dressing: Wound #1 Left Knee: Boardered Foam Dressing Wound #2 Right Knee: Boardered Foam Dressing Dressing Change Frequency: Wound #1 Left Knee: Change dressing every day. Wound #2 Right Knee: Change dressing every day. Follow-up Appointments: Wound #1 Left Knee: Return Appointment in 1 week. Wound #2 Right Knee: Return Appointment in 1 week. Edema Control: Other: - ace wraps to bilateral legs for the edema and change the daily to access legs and wash Off-Loading: Wound #1 Left Knee: Turn and reposition every 2 hours Wound #2 Right Knee: Turn and reposition every 2 hours Additional Orders / Instructions: Wound #1 Left Knee: Increase protein intake. Other: - vitamin C, zinc Wound #2 Right Knee: Increase protein intake. Other: - vitamin C, zinc The following medication(s) was prescribed: lidocaine topical 4 % cream 1 1 cream topical was prescribed at facility 1. hydrogel left knee daily per alf 2. foam border right knee 3. ace wrap (base of toes to just below knee) daily per alf 4. follow up next week Electronic Signature(s) Signed: 11/13/2017 1:20:34 PM By: Bonnell Public Previous Signature: 11/13/2017 1:13:12 PM Version By: Bonnell Public Entered By: Bonnell Public on 11/13/2017 13:20:34 Pitkin, [name] Vedia Coffer, Linton Flemings  P. ([id number]) -------------------------------------------------------------------------------- ROS/PFSH Details Patient Name: [name], HUSTEAD. Date of Service: 11/13/2017 10:30 AM Medical Record Number: [id number] Patient Account Number: [id number] Date of Birth/Sex: [birth year] (84 y.o. Female) Treating RN: Ashok Cordia, Debi Primary Care Provider: TATE, Katherina Right Other Clinician: Referring Provider: Dewaine Oats Treating Provider/Extender: Kathreen Cosier in Treatment: 0 Unable to Obtain Patient History due to oo Altered Mental Status Information Obtained From Caregiver Wound History Do you  currently have one or more open woundso Yes How many open wounds do you currently haveo 2 Approximately how long have you had your woundso 3 weeks How have you been treating your wound(s) until nowo unknown Has your wound(s) ever healed and then re-openedo No Have you had any lab work done in the past montho No Have you tested positive for an antibiotic resistant organism (MRSA, VRE)o No Have you tested positive for osteomyelitis (bone infection)o No Have you had any tests for circulation on your legso No Eyes Complaints and Symptoms: Positive for: Glasses / Contacts Genitourinary Complaints and Symptoms: Positive for: Incontinence/dribbling Integumentary (Skin) Complaints and Symptoms: Positive for: Wounds Psychiatric Complaints and Symptoms: Positive for: Anxiety Review of System Notes: depression Constitutional Symptoms (General Health) Complaints and Symptoms: No Complaints or Symptoms Ear/Nose/Mouth/Throat Complaints and Symptoms: No Complaints or Symptoms Hematologic/Lymphatic KIHANNA, KAMIYA ([id number]) Complaints and Symptoms: No Complaints or Symptoms Respiratory Complaints and Symptoms: No Complaints or Symptoms Cardiovascular Complaints and Symptoms: Review of System Notes: hyperlidemia Medical History: Positive for: Arrhythmia - a-fib; Coronary Artery Disease; Hypertension Gastrointestinal Complaints and Symptoms: No Complaints or Symptoms Endocrine Complaints and Symptoms: No Complaints or Symptoms Immunological Complaints and Symptoms: No Complaints or Symptoms Musculoskeletal Complaints and Symptoms: No Complaints or Symptoms Neurologic Medical History: Positive for: Dementia Oncologic Complaints and Symptoms: No Complaints or Symptoms Immunizations Pneumococcal Vaccine: Received Pneumococcal Vaccination: Yes Implantable Devices Family and Social History Marital Status - Widowed; Alcohol Use: Never; Drug Use: No History; Caffeine Use:  Daily; Financial Concerns: No; Food, Clothing or Shelter Needs: No; Support System Lacking: No; Transportation Concerns: No Notes CEARRA, PORTNOY. ([id number]) unable to get family hx, living will, medical power of attorney , advanced directive, smoking history d/t pt having altered mental status Electronic Signature(s) Signed: 11/13/2017 3:16:44 PM By: Alejandro Mulling Signed: 11/13/2017 3:49:02 PM By: Bonnell Public Entered By: Alejandro Mulling on 11/13/2017 10:31:55 Cowing, [name] ([id number]) -------------------------------------------------------------------------------- SuperBill Details Patient Name: [name]. Date of Service: 11/13/2017 Medical Record Number: [id number] Patient Account Number: [id number] Date of Birth/Sex: 30-Nov-1919 (84 y.o. Female) Treating RN: Phillis Haggis Primary Care Provider: Dewaine Oats Other Clinician: Referring Provider: Dewaine Oats Treating Provider/Extender: Kathreen Cosier in Treatment: 0 Diagnosis Coding ICD-10 Codes Code Description S80.211S Abrasion, right knee, sequela S80.212S Abrasion, left knee, sequela R54 Age-related physical debility I48.91 Unspecified atrial fibrillation E78.5 Hyperlipidemia, unspecified F03.90 Unspecified dementia without behavioral disturbance Facility Procedures CPT4 Code: [id number] Description: 99214 - WOUND CARE VISIT-LEV 4 EST PT Modifier: Quantity: 1 Physician Procedures CPT4 Code: [id number] Description: WC PHYS LEVEL 3 o NEW PT ICD-10 Diagnosis Description S80.211S Abrasion, right knee, sequela S80.212S Abrasion, left knee, sequela R54 Age-related physical debility F03.90 Unspecified dementia without behavioral disturbance Modifier: Quantity: 1 Electronic Signature(s) Signed: 11/13/2017 3:13:12 PM By: Alejandro Mulling Signed: 11/13/2017 3:49:02 PM By: Bonnell Public Previous Signature: 11/13/2017 1:14:20 PM Version By: Bonnell Public Entered By: Alejandro Mulling on 11/13/2017 15:13:12

## 2017-11-14 NOTE — Progress Notes (Signed)
SHERESA, CULLOP ([id number]) Visit Report for 11/13/2017 Abuse/Suicide Risk Screen Details Patient Name: [name], [name]. Date of Service: 11/13/2017 10:30 AM Medical Record Number: [id number] Patient Account Number: [id number] Date of Birth/Sex: 21-Mar-1920 (84 y.o. Female) Treating RN: Phillis Haggis Primary Care Jerimiah Wolman: Dewaine Oats Other Clinician: Referring Jarron Curley: Dewaine Oats Treating Idris Edmundson/Extender: Kathreen Cosier in Treatment: 0 Abuse/Suicide Risk Screen Items Answer Notes pt unable to answer d/t altered mental status Electronic Signature(s) Signed: 11/13/2017 3:16:44 PM By: Alejandro Mulling Entered By: Alejandro Mulling on 11/13/2017 10:32:24 Valenta, Leona Singleton ([id number]) -------------------------------------------------------------------------------- Activities of Daily Living Details Patient Name: [name]. Date of Service: 11/13/2017 10:30 AM Medical Record Number: [id number] Patient Account Number: [id number] Date of Birth/Sex: Sep 08, 1920 (84 y.o. Female) Treating RN: Ashok Cordia, Debi Primary Care Shaterrica Territo: Dewaine Oats Other Clinician: Referring Danique Hartsough: Dewaine Oats Treating Oshae Simmering/Extender: Kathreen Cosier in Treatment: 0 Activities of Daily Living Items Answer Activities of Daily Living (Please select one for each item) Drive Automobile Not Able Take Medications Not Able Use Telephone Not Able Care for Appearance Need Assistance Use Toilet Need Assistance Bath / Shower Need Assistance Dress Self Need Assistance Feed Self Completely Able Walk Not Able Get In / Out Bed Need Assistance Housework Not Able Prepare Meals Not Able Handle Money Not Able Shop for Self Not Able Electronic Signature(s) Signed: 11/13/2017 3:16:44 PM By: Alejandro Mulling Entered By: Alejandro Mulling on 11/13/2017 10:33:06 [name] ([id number]) -------------------------------------------------------------------------------- Education Assessment Details Patient Name:  [name]. Date of Service: 11/13/2017 10:30 AM Medical Record Number: [id number] Patient Account Number: [id number] Date of Birth/Sex: October 14, 1919 (84 y.o. Female) Treating RN: Ashok Cordia, Debi Primary Care Tymier Lindholm: TATE, [facility] Other Clinician: Referring Arrian Manson: Dewaine Oats Treating Barbie Croston/Extender: Kathreen Cosier in Treatment: 0 Primary Learner Assessed: Caregiver pt unable to answer d/t Reason Patient is not Primary Learner: altered mental status Learning Preferences/Education Level/Primary Language Learning Preference: Explanation, Printed Material Highest Education Level: College or Above Preferred Language: English Cognitive Barrier Assessment/Beliefs Language Barrier: No Translator Needed: No Memory Deficit: No Emotional Barrier: No Cultural/Religious Beliefs Affecting Medical Care: No Physical Barrier Assessment Impaired Vision: No Impaired Hearing: No Decreased Hand dexterity: No Knowledge/Comprehension Assessment Knowledge Level: Medium Comprehension Level: Medium Ability to understand written Medium instructions: Ability to understand verbal Medium instructions: Motivation Assessment Anxiety Level: Calm Cooperation: Cooperative Education Importance: Acknowledges Need Interest in Health Problems: Asks Questions Perception: Coherent Willingness to Engage in Self- Medium Management Activities: Readiness to Engage in Self- Medium Management Activities: Electronic Signature(s) Signed: 11/13/2017 3:16:44 PM By: Alejandro Mulling Entered By: Alejandro Mulling on 11/13/2017 10:33:33 Airika, Alkhatib Leona Singleton ([id number]) -------------------------------------------------------------------------------- Fall Risk Assessment Details Patient Name: [name]. Date of Service: 11/13/2017 10:30 AM Medical Record Number: [id number] Patient Account Number: [id number] Date of Birth/Sex: Jul 01, 1920 (84 y.o. Female) Treating RN: Ashok Cordia, Debi Primary Care Natalie Leclaire:  TATE, Katherina Right Other Clinician: Referring Weslie Pretlow: Dewaine Oats Treating Hutton Pellicane/Extender: Kathreen Cosier in Treatment: 0 Fall Risk Assessment Items Have you had 2 or more falls in the last 12 monthso 0 Yes Have you had any fall that resulted in injury in the last 12 monthso 0 Yes FALL RISK ASSESSMENT: History of falling - immediate or within 3 months 25 Yes Secondary diagnosis 15 Yes Ambulatory aid None/bed rest/wheelchair/nurse 0 Yes Crutches/cane/walker 15 Yes Furniture 0 No IV Access/Saline Lock 0 No Gait/Training Normal/bed rest/immobile 0 No Weak 10 Yes Impaired 20 Yes Mental Status Oriented to own ability 0 No Electronic Signature(s) Signed: 11/13/2017 3:16:44 PM By: Alejandro Mulling Entered By: Ashok Cordia,  Debra on 11/13/2017 10:34:01 [name], Jeanette P. ([id number]) -------------------------------------------------------------------------------- Nutrition Risk Assessment Details Patient Name: [name], Jeanette P. Date of Service: 11/13/2017 10:30 AM Medical Record Number: [id number] Patient Account Number: [id number] Date of Birth/Sex: 03/11/1920 (84 y.o. Female) Treating RN: Phillis HaggisPinkerton, Debi Primary Care Natallie Ravenscroft: Dewaine OatsATE, DENNY Other Clinician: Referring Stephenie Navejas: Dewaine OatsATE, DENNY Treating Lavan Imes/Extender: Kathreen Cosieroulter, Leah Weeks in Treatment: 0 Height (in): Weight (lbs): Body Mass Index (BMI): Nutrition Risk Assessment Items NUTRITION RISK SCREEN: I have an illness or condition that made me change the kind and/or amount of 2 Yes food I eat I eat fewer than two meals per day 0 No I eat few fruits and vegetables, or milk products 0 No I have three or more drinks of beer, liquor or wine almost every day 0 No I have tooth or mouth problems that make it hard for me to eat 0 No I don't always have enough money to buy the food I need 0 No I eat alone most of the time 0 No I take three or more different prescribed or over-the-counter drugs a day 1 Yes Without wanting to, I have lost or  gained 10 pounds in the last six months 0 No I am not always physically able to shop, cook and/or feed myself 0 No Nutrition Protocols Good Risk Protocol Moderate Risk Protocol Electronic Signature(s) Signed: 11/13/2017 3:16:44 PM By: Alejandro MullingPinkerton, Debra Entered By: Alejandro MullingPinkerton, Debra on 11/13/2017 10:34:13

## 2017-11-14 NOTE — Progress Notes (Signed)
JANNETH, KRASNER ([id number]) Visit Report for 11/13/2017 Allergy List Details Patient Name: [name], UTECHT. Date of Service: 11/13/2017 10:30 AM Medical Record Number: [id number] Patient Account Number: [id number] Date of Birth/Sex: 1920/05/26 (84 y.o. Female) Treating RN: Phillis Haggis Primary Care Krisy Dix: Dewaine Oats Other Clinician: Referring Holley Wirt: Dewaine Oats Treating Tranika Scholler/Extender: Bonnell Public Weeks in Treatment: 0 Allergies Active Allergies NKDA Allergy Notes Electronic Signature(s) Signed: 11/13/2017 3:16:44 PM By: Alejandro Mulling Entered By: Alejandro Mulling on 11/13/2017 10:26:48 [name] ([id number]) -------------------------------------------------------------------------------- Arrival Information Details Patient Name: [name]. Date of Service: 11/13/2017 10:30 AM Medical Record Number: [id number] Patient Account Number: [id number] Date of Birth/Sex: Miles 07, 1921 (84 y.o. Female) Treating RN: Ashok Cordia, Debi Primary Care Camesha Farooq: Dewaine Oats Other Clinician: Referring Nazier Neyhart: Dewaine Oats Treating Daelyn Mozer/Extender: Kathreen Cosier in Treatment: 0 Visit Information Patient Arrived: Wheel Chair Arrival Time: 10:21 Accompanied By: caregiver Transfer Assistance: EasyPivot Patient Lift Patient Identification Verified: Yes Secondary Verification Process Yes Completed: Patient Requires Transmission-Based No Precautions: Patient Has Alerts: No Electronic Signature(s) Signed: 11/13/2017 3:16:44 PM By: Alejandro Mulling Entered By: Alejandro Mulling on 11/13/2017 10:23:47 Espaillat, [name] ([id number]) -------------------------------------------------------------------------------- Clinic Level of Care Assessment Details Patient Name: [name]. Date of Service: 11/13/2017 10:30 AM Medical Record Number: [id number] Patient Account Number: [id number] Date of Birth/Sex: 1920/03/21 (84 y.o. Female) Treating RN: Ashok Cordia, Debi Primary Care  Jameal Razzano: TATE, [facility] Other Clinician: Referring Dante Roudebush: Dewaine Oats Treating Rashauna Tep/Extender: Kathreen Cosier in Treatment: 0 Clinic Level of Care Assessment Items TOOL 2 Quantity Score X - Use when only an EandM is performed on the INITIAL visit 1 0 ASSESSMENTS - Nursing Assessment / Reassessment X - General Physical Exam (combine w/ comprehensive assessment (listed just below) when 1 20 performed on new pt. evals) X- 1 25 Comprehensive Assessment (HX, ROS, Risk Assessments, Wounds Hx, etc.) ASSESSMENTS - Wound and Skin Assessment / Reassessment []  - Simple Wound Assessment / Reassessment - one wound 0 X- 2 5 Complex Wound Assessment / Reassessment - multiple wounds []  - 0 Dermatologic / Skin Assessment (not related to wound area) ASSESSMENTS - Ostomy and/or Continence Assessment and Care []  - Incontinence Assessment and Management 0 []  - 0 Ostomy Care Assessment and Management (repouching, etc.) PROCESS - Coordination of Care []  - Simple Patient / Family Education for ongoing care 0 X- 1 20 Complex (extensive) Patient / Family Education for ongoing care X- 1 10 Staff obtains Chiropractor, Records, Test Results / Process Orders X- 1 10 Staff telephones HHA, Nursing Homes / Clarify orders / etc []  - 0 Routine Transfer to another Facility (non-emergent condition) []  - 0 Routine Hospital Admission (non-emergent condition) []  - 0 New Admissions / Manufacturing engineer / Ordering NPWT, Apligraf, etc. []  - 0 Emergency Hospital Admission (emergent condition) X- 1 10 Simple Discharge Coordination []  - 0 Complex (extensive) Discharge Coordination PROCESS - Special Needs []  - Pediatric / Minor Patient Management 0 []  - 0 Isolation Patient Management AHRIANNA, SIGLIN. ([id number]) []  - 0 Hearing / Language / Visual special needs []  - 0 Assessment of Community assistance (transportation, D/C planning, etc.) []  - 0 Additional assistance / Altered mentation []  -  0 Support Surface(s) Assessment (bed, cushion, seat, etc.) INTERVENTIONS - Wound Cleansing / Measurement X - Wound Imaging (photographs - any number of wounds) 1 5 []  - 0 Wound Tracing (instead of photographs) []  - 0 Simple Wound Measurement - one wound X- 2 5 Complex Wound Measurement - multiple wounds []  - 0 Simple Wound Cleansing - one  wound X- 2 5 Complex Wound Cleansing - multiple wounds INTERVENTIONS - Wound Dressings X - Small Wound Dressing one or multiple wounds 2 10 []  - 0 Medium Wound Dressing one or multiple wounds []  - 0 Large Wound Dressing one or multiple wounds []  - 0 Application of Medications - injection INTERVENTIONS - Miscellaneous []  - External ear exam 0 []  - 0 Specimen Collection (cultures, biopsies, blood, body fluids, etc.) []  - 0 Specimen(s) / Culture(s) sent or taken to Lab for analysis []  - 0 Patient Transfer (multiple staff / Nurse, adult / Similar devices) []  - 0 Simple Staple / Suture removal (25 or less) []  - 0 Complex Staple / Suture removal (26 or more) []  - 0 Hypo / Hyperglycemic Management (close monitor of Blood Glucose) []  - 0 Ankle / Brachial Index (ABI) - do not check if billed separately Has the patient been seen at the hospital within the last three years: Yes Total Score: 150 Level Of Care: New/Established - Level 4 Electronic Signature(s) Signed: 11/13/2017 3:16:44 PM By: Alejandro Mulling Entered By: Alejandro Mulling on 11/13/2017 15:13:03 Poth, [name] ([id number]) -------------------------------------------------------------------------------- Encounter Discharge Information Details Patient Name: [name]. Date of Service: 11/13/2017 10:30 AM Medical Record Number: [id number] Patient Account Number: [id number] Date of Birth/Sex: [birth year] (84 y.o. Female) Treating RN: Ashok Cordia, Debi Primary Care Cecilee Rosner: Dewaine Oats Other Clinician: Referring Talar Fraley: Dewaine Oats Treating Damita Eppard/Extender: Kathreen Cosier in  Treatment: 0 Encounter Discharge Information Items Discharge Pain Level: 0 Discharge Condition: Stable Ambulatory Status: Wheelchair Nursing Discharge Destination: Home Transportation: Private Auto Accompanied By: caregiver Schedule Follow-up Appointment: Yes Medication Reconciliation completed and No provided to Patient/Care Emunah Texidor: Clinical Summary of Care: Electronic Signature(s) Signed: 11/13/2017 11:02:24 AM By: Alejandro Mulling Entered By: Alejandro Mulling on 11/13/2017 11:02:24 [name] ([id number]) -------------------------------------------------------------------------------- Lower Extremity Assessment Details Patient Name: [name]. Date of Service: 11/13/2017 10:30 AM Medical Record Number: [id number] Patient Account Number: [id number] Date of Birth/Sex: December 23, 1919 (84 y.o. Female) Treating RN: Phillis Haggis Primary Care Howie Rufus: Dewaine Oats Other Clinician: Referring Kerrick Miler: Dewaine Oats Treating Maryana Pittmon/Extender: Bonnell Public Weeks in Treatment: 0 Electronic Signature(s) Signed: 11/13/2017 3:16:44 PM By: Alejandro Mulling Entered By: Alejandro Mulling on 11/13/2017 10:50:16 Thibeau, [name] ([id number]) -------------------------------------------------------------------------------- Multi Wound Chart Details Patient Name: [name]. Date of Service: 11/13/2017 10:30 AM Medical Record Number: [id number] Patient Account Number: [id number] Date of Birth/Sex: [birth year] (84 y.o. Female) Treating RN: Ashok Cordia, Debi Primary Care Milas Schappell: TATE, [facility] Other Clinician: Referring Sosie Gato: Dewaine Oats Treating Yuri Flener/Extender: Kathreen Cosier in Treatment: 0 Vital Signs Height(in): Pulse(bpm): 64 Weight(lbs): Blood Pressure(mmHg): 127/57 Body Mass Index(BMI): Temperature(F): 97.6 Respiratory Rate 16 (breaths/min): Photos: [1:No Photos] [2:No Photos] [N/A:N/A] Wound Location: [1:Left Knee] [2:Right Knee] [N/A:N/A] Wounding Event:  [1:Trauma] [2:Trauma] [N/A:N/A] Primary Etiology: [1:Trauma, Other] [2:Trauma, Other] [N/A:N/A] Comorbid History: [1:Arrhythmia, Coronary Artery Disease, Hypertension, Dementia] [2:Arrhythmia, Coronary Artery Disease, Hypertension, Dementia] [N/A:N/A] Date Acquired: [1:10/30/2017] [2:10/30/2017] [N/A:N/A] Weeks of Treatment: [1:0] [2:0] [N/A:N/A] Wound Status: [1:Open] [2:Open] [N/A:N/A] Clustered Wound: [1:Yes] [2:Yes] [N/A:N/A] Clustered Quantity: [1:2] [2:3] [N/A:N/A] Measurements L x W x D [1:2.5x1x0.1] [2:0.7x0.9x0.1] [N/A:N/A] (cm) Area (cm) : [1:1.963] [2:0.495] [N/A:N/A] Volume (cm) : [1:0.196] [2:0.049] [N/A:N/A] % Reduction in Area: [1:0.00%] [2:N/A] [N/A:N/A] % Reduction in Volume: [1:0.00%] [2:N/A] [N/A:N/A] Classification: [1:Full Thickness Without Exposed Support Structures] [2:Full Thickness Without Exposed Support Structures] [N/A:N/A] Exudate Amount: [1:Large] [2:Large] [N/A:N/A] Exudate Type: [1:Serosanguineous] [2:Serosanguineous] [N/A:N/A] Exudate Color: [1:red, brown] [2:red, brown] [N/A:N/A] Wound Margin: [1:Distinct, outline attached] [2:Distinct, outline attached] [N/A:N/A] Granulation Amount: [1:Small (  1-33%)] [2:Large (67-100%)] [N/A:N/A] Granulation Quality: [1:Red] [2:Red] [N/A:N/A] Necrotic Amount: [1:Large (67-100%)] [2:None Present (0%)] [N/A:N/A] Necrotic Tissue: [1:Eschar] [2:N/A] [N/A:N/A] Exposed Structures: [1:Fascia: No Fat Layer (Subcutaneous Tissue) Exposed: No Tendon: No Muscle: No Joint: No Bone: No] [2:Fascia: No Fat Layer (Subcutaneous Tissue) Exposed: No Tendon: No Muscle: No Joint: No Bone: No] [N/A:N/A] Epithelialization: [1:None] [2:None] [N/A:N/A] Periwound Skin Texture: [1:No Abnormalities Noted] [2:No Abnormalities Noted] [N/A:N/A] Periwound Skin Moisture: No Abnormalities Noted No Abnormalities Noted N/A Periwound Skin Color: No Abnormalities Noted No Abnormalities Noted N/A Temperature: No Abnormality No Abnormality N/A Tenderness on  Palpation: Yes Yes N/A Wound Preparation: Ulcer Cleansing: Ulcer Cleansing: N/A Rinsed/Irrigated with Saline Rinsed/Irrigated with Saline Topical Anesthetic Applied: Topical Anesthetic Applied: Other: lidocaine 4% Other: lidocaine 4% Treatment Notes Electronic Signature(s) Signed: 11/13/2017 11:55:20 AM By: Bonnell Public Previous Signature: 11/13/2017 11:01:53 AM Version By: Alejandro Mulling Entered By: Bonnell Public on 11/13/2017 11:55:19 Ranisha, Allaire [name] ([id number]) -------------------------------------------------------------------------------- Multi-Disciplinary Care Plan Details Patient Name: [name], Jeanette P. Date of Service: 11/13/2017 10:30 AM Medical Record Number: [id number] Patient Account Number: [id number] Date of Birth/Sex: 1920-06-20 (84 y.o. Female) Treating RN: Ashok Cordia, Debi Primary Care Marja Adderley: TATE, [facility] Other Clinician: Referring Ivar Domangue: Dewaine Oats Treating Jamille Fisher/Extender: Kathreen Cosier in Treatment: 0 Active Inactive ` Abuse / Safety / Falls / Self Care Management Nursing Diagnoses: History of Falls Potential for falls Goals: Patient will not experience any injury related to falls Date Initiated: 11/13/2017 Target Resolution Date: 03/14/2018 Goal Status: Active Patient will remain injury free related to falls Date Initiated: 11/13/2017 Target Resolution Date: 03/14/2018 Goal Status: Active Interventions: Assess Activities of Daily Living upon admission and as needed Assess fall risk on admission and as needed Assess: immobility, friction, shearing, incontinence upon admission and as needed Assess impairment of mobility on admission and as needed per policy Assess personal safety and home safety (as indicated) on admission and as needed Assess self care needs on admission and as needed Notes: ` Nutrition Nursing Diagnoses: Imbalanced nutrition Potential for alteratiion in Nutrition/Potential for imbalanced nutrition Goals: Patient/caregiver  agrees to and verbalizes understanding of need to use nutritional supplements and/or vitamins as prescribed Date Initiated: 11/13/2017 Target Resolution Date: 03/07/2018 Goal Status: Active Interventions: Assess patient nutrition upon admission and as needed per policy Notes: ZAIRE, VANBUSKIRK ([id number]) Orientation to the Wound Care Program Nursing Diagnoses: Knowledge deficit related to the wound healing center program Goals: Patient/caregiver will verbalize understanding of the Wound Healing Center Program Date Initiated: 11/13/2017 Target Resolution Date: 12/13/2017 Goal Status: Active Interventions: Provide education on orientation to the wound center Notes: ` Pain, Acute or Chronic Nursing Diagnoses: Pain, acute or chronic: actual or potential Potential alteration in comfort, pain Goals: Patient/caregiver will verbalize adequate pain control between visits Date Initiated: 11/13/2017 Target Resolution Date: 03/14/2018 Goal Status: Active Interventions: Complete pain assessment as per visit requirements Notes: ` Wound/Skin Impairment Nursing Diagnoses: Impaired tissue integrity Knowledge deficit related to ulceration/compromised skin integrity Goals: Ulcer/skin breakdown will have a volume reduction of 80% by week 12 Date Initiated: 11/13/2017 Target Resolution Date: 03/07/2018 Goal Status: Active Interventions: Assess patient/caregiver ability to perform ulcer/skin care regimen upon admission and as needed Assess ulceration(s) every visit Notes: Electronic Signature(s) Signed: 11/13/2017 11:01:43 AM By: Alejandro Mulling Entered By: Alejandro Mulling on 11/13/2017 11:01:41 Bevis, [name] ([id number]) Vedia Coffer, [name] ([id number]) -------------------------------------------------------------------------------- Pain Assessment Details Patient Name: [name]. Date of Service: 11/13/2017 10:30 AM Medical Record Number: [id number] Patient Account Number: [id number] Date of  Birth/Sex: [birth year] (84 y.o.  Female) Treating RN: Ashok Cordia, Debi Primary Care Azir Muzyka: Dewaine Oats Other Clinician: Referring Jazzmon Prindle: Dewaine Oats Treating Porchia Sinkler/Extender: Kathreen Cosier in Treatment: 0 Active Problems Location of Pain Severity and Description of Pain Patient Has Paino No Site Locations Pain Management and Medication Current Pain Management: Electronic Signature(s) Signed: 11/13/2017 3:16:44 PM By: Alejandro Mulling Entered By: Alejandro Mulling on 11/13/2017 10:23:55 Padovano, [name] ([id number]) -------------------------------------------------------------------------------- Patient/Caregiver Education Details Patient Name: [name]. Date of Service: 11/13/2017 10:30 AM Medical Record Number: [id number] Patient Account Number: [id number] Date of Birth/Gender: September 30, 1920 (84 y.o. Female) Treating RN: Phillis Haggis Primary Care Physician: Dewaine Oats Other Clinician: Referring Physician: Dewaine Oats Treating Physician/Extender: Kathreen Cosier in Treatment: 0 Education Assessment Education Provided To: Caregiver Education Topics Provided Welcome To The Wound Care Center: Handouts: Welcome To The Wound Care Center Methods: Explain/Verbal Responses: State content correctly Wound/Skin Impairment: Handouts: Caring for Your Ulcer, Other: snf orders sent with pt Methods: Demonstration, Explain/Verbal Responses: State content correctly Electronic Signature(s) Signed: 11/13/2017 3:16:44 PM By: Alejandro Mulling Entered By: Alejandro Mulling on 11/13/2017 11:02:55 Easton, [name] -------------------------------------------------------------------------------- Wound Assessment Details Patient Name: [name]. Date of Service: 11/13/2017 10:30 AM Medical Record Number: [id number] Patient Account Number: [id number] Date of Birth/Sex: 1920-09-08 (84 y.o. Female) Treating RN: Ashok Cordia, Debi Primary Care Ivery Michalski: TATE, [facility] Other  Clinician: Referring Romeka Scifres: Dewaine Oats Treating Darnette Lampron/Extender: Kathreen Cosier in Treatment: 0 Wound Status Wound Number: 1 Primary Trauma, Other Etiology: Wound Location: Left Knee Wound Status: Open Wounding Event: Trauma Comorbid Arrhythmia, Coronary Artery Disease, Date Acquired: 10/30/2017 History: Hypertension, Dementia Weeks Of Treatment: 0 Clustered Wound: Yes Photos Photo Uploaded By: Alejandro Mulling on 11/13/2017 12:42:56 Wound Measurements Length: (cm) 2.5 Width: (cm) 1 Depth: (cm) 0.1 Clustered Quantity: 2 Area: (cm) 1.963 Volume: (cm) 0.196 % Reduction in Area: 0% % Reduction in Volume: 0% Epithelialization: None Tunneling: No Undermining: No Wound Description Full Thickness Without Exposed Support Classification: Structures Wound Margin: Distinct, outline attached Exudate Large Amount: Exudate Type: Serosanguineous Exudate Color: red, brown Foul Odor After Cleansing: No Slough/Fibrino Yes Wound Bed Granulation Amount: Small (1-33%) Exposed Structure Granulation Quality: Red Fascia Exposed: No Necrotic Amount: Large (67-100%) Fat Layer (Subcutaneous Tissue) Exposed: No Necrotic Quality: Eschar Tendon Exposed: No Muscle Exposed: No Joint Exposed: No Gentile, Amita P. ([id number]) Bone Exposed: No Periwound Skin Texture Texture Color No Abnormalities Noted: No No Abnormalities Noted: No Moisture Temperature / Pain No Abnormalities Noted: No Temperature: No Abnormality Tenderness on Palpation: Yes Wound Preparation Ulcer Cleansing: Rinsed/Irrigated with Saline Topical Anesthetic Applied: Other: lidocaine 4%, Treatment Notes Wound #1 (Left Knee) 1. Cleansed with: Clean wound with Normal Saline 2. Anesthetic Topical Lidocaine 4% cream to wound bed prior to debridement 3. Peri-wound Care: Skin Prep 4. Dressing Applied: Hydrogel 5. Secondary Dressing Applied Bordered Foam Dressing Notes ace wraps bilateral  legs Electronic Signature(s) Signed: 11/13/2017 3:16:44 PM By: Alejandro Mulling Entered By: Alejandro Mulling on 11/13/2017 10:54:13 Paolini, [name] ([id number]) -------------------------------------------------------------------------------- Wound Assessment Details Patient Name: [name]. Date of Service: 11/13/2017 10:30 AM Medical Record Number: [id number] Patient Account Number: [id number] Date of Birth/Sex: [birth year] (84 y.o. Female) Treating RN: Ashok Cordia, Debi Primary Care Carrianne Hyun: Dewaine Oats Other Clinician: Referring Shomari Scicchitano: Dewaine Oats Treating Morton Simson/Extender: Kathreen Cosier in Treatment: 0 Wound Status Wound Number: 2 Primary Trauma, Other Etiology: Wound Location: Right Knee Wound Status: Open Wounding Event: Trauma Comorbid Arrhythmia, Coronary Artery Disease, Date Acquired: 10/30/2017 History: Hypertension, Dementia Weeks Of Treatment: 0 Clustered Wound: Yes Photos Photo Uploaded  By: Alejandro MullingPinkerton, Debra on 11/13/2017 12:42:56 Wound Measurements Length: (cm) 0.7 Width: (cm) 0.9 Depth: (cm) 0.1 Clustered Quantity: 3 Area: (cm) 0.495 Volume: (cm) 0.049 % Reduction in Area: % Reduction in Volume: Epithelialization: None Tunneling: No Undermining: No Wound Description Full Thickness Without Exposed Support Classification: Structures Wound Margin: Distinct, outline attached Exudate Large Amount: Exudate Type: Serosanguineous Exudate Color: red, brown Foul Odor After Cleansing: No Slough/Fibrino No Wound Bed Granulation Amount: Large (67-100%) Exposed Structure Granulation Quality: Red Fascia Exposed: No Necrotic Amount: None Present (0%) Fat Layer (Subcutaneous Tissue) Exposed: No Tendon Exposed: No Muscle Exposed: No Joint Exposed: No Suastegui, Shakeira P. ([id number]) Bone Exposed: No Periwound Skin Texture Texture Color No Abnormalities Noted: No No Abnormalities Noted: No Moisture Temperature / Pain No Abnormalities Noted:  No Temperature: No Abnormality Tenderness on Palpation: Yes Wound Preparation Ulcer Cleansing: Rinsed/Irrigated with Saline Topical Anesthetic Applied: Other: lidocaine 4%, Treatment Notes Wound #2 (Right Knee) 1. Cleansed with: Clean wound with Normal Saline 2. Anesthetic Topical Lidocaine 4% cream to wound bed prior to debridement 3. Peri-wound Care: Skin Prep 5. Secondary Dressing Applied Bordered Foam Dressing Notes ace wraps bilateral legs Electronic Signature(s) Signed: 11/13/2017 3:16:44 PM By: Alejandro MullingPinkerton, Debra Entered By: Alejandro MullingPinkerton, Debra on 11/13/2017 10:55:14 Pries, [name] ([id number]) -------------------------------------------------------------------------------- Vitals Details Patient Name: Jeanette AugustBLACK, Elaura P. Date of Service: 11/13/2017 10:30 AM Medical Record Number: [id number] Patient Account Number: [id number] Date of Birth/Sex: 01/16/1920 (84 y.o. Female) Treating RN: Ashok CordiaPinkerton, Debi Primary Care K[facility] Aslin[facility] Other Clinician: Referring Clever Geraldo: Dewaine OatsATE, DENNY Treating Marcelia Petersen/Extender: Kathreen Cosieroulter, Leah Weeks in Treatment: 0 Vital Signs Time Taken: 10:23 Temperature (F): 97.6 Pulse (bpm): 64 Respiratory Rate (breaths/min): 16 Blood Pressure (mmHg): 127/57 Reference Range: 80 - 120 mg / dl Electronic Signature(s) Signed: 11/13/2017 3:16:44 PM By: Alejandro MullingPinkerton, Debra Entered By: Alejandro MullingPinkerton, Debra on 11/13/2017 10:26:00

## 2017-11-20 ENCOUNTER — Encounter: Payer: Medicare Other | Admitting: Nurse Practitioner

## 2017-11-20 DIAGNOSIS — S80211A Abrasion, right knee, initial encounter: Secondary | ICD-10-CM | POA: Diagnosis not present

## 2017-11-23 NOTE — Progress Notes (Signed)
[name], [name] ([id number]) Visit Report for 11/20/2017 Chief Complaint Document Details Patient Name: [name], [name]. Date of Service: 11/20/2017 9:45 AM Medical Record Number: [id number] Patient Account Number: [id number] Date of Birth/Sex: Feb 12, 1920 (84 y.o. Female) Treating RN: Phillis Haggis Primary Care Provider: Dewaine Oats Other Clinician: Referring Provider: Dewaine Oats Treating Provider/Extender: Kathreen Cosier in Treatment: 1 Information Obtained from: Patient Chief Complaint She is here for evaluation of bilateral knee wounds Electronic Signature(s) Signed: 11/20/2017 11:09:10 AM By: Bonnell Public Entered By: Bonnell Public on 11/20/2017 11:09:10 [name], [name] ([id number]) -------------------------------------------------------------------------------- HPI Details Patient Name: [name]. Date of Service: 11/20/2017 9:45 AM Medical Record Number: [id number] Patient Account Number: [id number] Date of Birth/Sex: [birth year] (84 y.o. Female) Treating RN: Phillis Haggis Primary Care Provider: TATE, [facility] Other Clinician: Referring Provider: Dewaine Oats Treating Provider/Extender: Kathreen Cosier in Treatment: 1 History of Present Illness Location: bilateral knees Quality: pt unable to qualify r/t dementia Severity: pt unable to quantify r/t dementia Duration: approximately one month (January 2018) Context: s/p fall, traumatic injury HPI Description: 11/13/17 patient is here for initial evaluation for bilateral knee traumatic injury. Information is obtained from outside medical records and personal companion. She sustained a fall 3-4 weeks ago, likely slipped while transferring from her wheelchair to the commode and landed on her knees; she has been less mobile since then per companion. According to the companion there has been no topical treatment that she is aware of, although she is not responsible for bathing, dressing, performing/assisting ADLs. Patient  resides in [facility] assisted living, does not have home health, has private duty companionship from [facility] 8 hours a day (8-12:30 and 13:30-17:30). Patient is unable to provide any specific information or details. She last saw primary care yesterday where she was referred to walk-in clinic for x-ray secondary to persistent pain and referred to wound care center 11/20/17-she is here in follow-up evaluation for bilateral knee injuries. 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 The right knee has a small amount of partial-thickness opening and anticipate that will be healed at next week's appointment. We will continue with current treatment plan a follow-up next week. Electronic Signature(s) Signed: 11/20/2017 11:10:10 AM By: Bonnell Public Entered By: Bonnell Public on 11/20/2017 11:10:10 [name] ([id number]) -------------------------------------------------------------------------------- Physician Orders Details Patient Name: [name]. Date of Service: 11/20/2017 9:45 AM Medical Record Number: [id number] Patient Account Number: [id number] Date of Birth/Sex: 1920-05-03 (84 y.o. Female) Treating RN: Ashok Cordia, Debi Primary Care Provider: Dewaine Oats Other Clinician: Referring Provider: Dewaine Oats Treating Provider/Extender: Kathreen Cosier in Treatment: 1 Verbal / Phone Orders: Yes Clinician: Pinkerton, Debi Read Back and Verified: Yes Diagnosis Coding Wound Cleansing Wound #2 Right Knee o Clean wound with Normal Saline. o Cleanse wound with mild soap and water o May Shower, gently pat wound dry prior to applying new dressing. Anesthetic (add to Medication List) Wound #2 Right Knee o Topical Lidocaine 4% cream applied to wound bed prior to debridement (In Clinic Only). o Hurricaine Topical Anesthetic Spray applied to wound bed prior to debridement (In Clinic Only). Skin Barriers/Peri-Wound Care Wound #2 Right Knee o Skin Prep Secondary  Dressing Wound #2 Right Knee o Boardered Foam Dressing Dressing Change Frequency Wound #2 Right Knee o Change dressing every week Follow-up Appointments Wound #2 Right Knee o Return Appointment in 1 week. Off-Loading Wound #2 Right Knee o Turn and reposition every 2 hours Additional Orders / Instructions Wound #2 Right Knee o Increase protein intake. o Other: -  vitamin C, zinc Patient Medications Allergies: NKDA Notifications Medication Indication Start End lidocaine Tess, Blakley P. ([id number]) Notifications Medication Indication Start End DOSE 1 - topical 4 % cream - 1 cream topical Electronic Signature(s) Signed: 11/20/2017 5:17:02 PM By: Bonnell Publicoulter, Callen Zuba Entered By: Bonnell Publicoulter, Kristyl Athens on 11/20/2017 11:10:33 [name], [name] ([id number]) -------------------------------------------------------------------------------- Prescription 11/20/2017 Patient Name: [name], Jeanette P. Provider: Bonnell Publicoulter, Jacqueline Delapena NP Date of Birth: 04/13/1920 NPI#: [id number] Sex: F DEA#: [id number] Phone #: [phone] License #: Patient Address[facility] [address] [facility] [address], [address] [place] [address] [place] [address] [phone] Allergies NKDA Medication Medication: Route: Strength: Form: lidocaine 4 % topical cream topical 4% cream Class: TOPICAL LOCAL ANESTHETICS Dose: Frequency / Time: Indication: 1 1 cream topical Number of Refills: Number of Units: 0 Generic Substitution: Start Date: End Date: One Time Use: Substitution Permitted No Note to Pharmacy: Signature(s): Date(s): Electronic Signature(s) Signed: 11/20/2017 5:17:02 PM By: Bonnell Publicoulter, Bryen Hinderman Entered By: Bonnell Publicoulter, Leylanie Woodmansee on 11/20/2017 11:10:34 [name], [name] ([id number]) --------------------------------------------------------------------------------  Problem List Details Patient Name: Jeanette AugustBLACK, [place]am[place]  P. Date of Service: 11/20/2017 9:45 AM Medical Record Number: [id number] Patient Account Number: [id number] Date of Birth/Sex: 02/14/1920 (84 y.o. Female) Treating RN: Ashok CordiaPinkerton, Debi Primary Care Provider: Dewaine OatsATE, DENNY Other Clinician: Referring Provider: Dewaine OatsATE, DENNY Treating Provider/Extender: Kathreen Cosieroulter, Camelle Henkels Weeks in Treatment: 1 Active Problems ICD-10 Encounter Code Description Active Date Diagnosis S80.211S Abrasion, right knee, sequela 11/13/2017 Yes S80.212S Abrasion, left knee, sequela 11/13/2017 Yes R54 Age-related physical debility 11/13/2017 Yes I48.91 Unspecified atrial fibrillation 11/13/2017 Yes E78.5 Hyperlipidemia, unspecified 11/13/2017 Yes F03.90 Unspecified dementia without behavioral disturbance 11/13/2017 Yes Inactive Problems Resolved Problems Electronic Signature(s) Signed: 11/20/2017 11:08:46 AM By: Bonnell Publicoulter, Keeshia Sanderlin Previous Signature: 11/20/2017 10:54:16 AM Version By: Bonnell Publicoulter, Teyton Pattillo Entered By: Bonnell Publicoulter, Jahseh Lucchese on 11/20/2017 11:08:46 Innis, [name] ([id number]) -------------------------------------------------------------------------------- Progress Note Details Patient Name: Jeanette AugustBLACK, Tyjanae P. Date of Service: 11/20/2017 9:45 AM Medical Record Number: [id number] Patient Account Number: [id number] Date of Birth/Sex: 06/29/1920 (84 y.o. Female) Treating RN: Ashok CordiaPinkerton, Debi [facility] Other Clinician: Referring Provider: Dewaine OatsATE, DENNY Treating Provider/Extender: Kathreen Cosieroulter, Sara Selvidge Weeks in Treatment: 1 Subjective Chief Complaint Information obtained from Patient She is here for evaluation of bilateral knee wounds History of Present Illness (HPI) The following HPI elements were documented for the patient's wound: Location: bilateral knees Quality: pt unable to qualify r/t dementia Severity: pt unable to quantify r/t dementia Duration: approximately one month (January 2018) Context: s/p fall, traumatic injury 11/13/17 patient is here for initial evaluation for  bilateral knee traumatic injury. Information is obtained from outside medical records and personal companion. She sustained a fall 3-4 weeks ago, likely slipped while transferring from her wheelchair to the commode and landed on her knees; she has been less mobile since then per companion. According to the companion there has been no topical treatment that she is aware of, although she is not responsible for bathing, dressing, performing/assisting ADLs. Patient resides in [facility] assisted living, does not have home health, has private duty companionship from [facility] 8 hours a day (8-12:30 and 13:30-17:30). Patient is unable to provide any specific information or details. She last saw primary care yesterday where she was referred to walk-in clinic for x-ray secondary to persistent pain and referred to wound care center 11/20/17-she is here in follow-up evaluation for bilateral knee injuries. 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 The right knee has a small amount of partial-thickness opening and anticipate that  will be healed at next week's appointment. We will continue with current treatment plan a follow-up next week. Patient History Unable to Obtain Patient History due to Altered Mental Status. Information obtained from Patient. Social History Marital Status - Widowed, Alcohol Use - Never, Drug Use - No History, Caffeine Use - Daily. Objective Constitutional [name], Jeanette P. ([id number]) Vitals Time Taken: 10:01 AM, Temperature: 97.6 F, Pulse: 72 bpm, Respiratory Rate: 16 breaths/min, Blood Pressure: 147/73 mmHg. Integumentary (Hair, Skin) Wound #1 status is Open. Original cause of wound was Trauma. The wound is located on the Left Knee. The wound measures 0cm length x 0cm width x 0cm depth; 0cm^2 area and 0cm^3 volume. There is no tunneling or undermining noted. There is a none present amount of drainage noted. The wound margin is distinct with the outline attached to the  wound base. There is no granulation within the wound bed. There is no necrotic tissue within the wound bed. Periwound temperature was noted as No Abnormality. The periwound has tenderness on palpation. Wound #2 status is Open. Original cause of wound was Trauma. The wound is located on the Right Knee. The wound measures 0.2cm length x 0.3cm width x 0.1cm depth; 0.047cm^2 area and 0.005cm^3 volume. There is no tunneling or undermining noted. There is a medium amount of serosanguineous drainage noted. The wound margin is distinct with the outline attached to the wound base. There is large (67-100%) red granulation within the wound bed. There is no necrotic tissue within the wound bed. Periwound temperature was noted as No Abnormality. The periwound has tenderness on palpation. Assessment Active Problems ICD-10 S80.211S - Abrasion, right knee, sequela S80.212S - Abrasion, left knee, sequela R54 - Age-related physical debility I48.91 - Unspecified atrial fibrillation E78.5 - Hyperlipidemia, unspecified F03.90 - Unspecified dementia without behavioral disturbance Plan Wound Cleansing: Wound #2 Right Knee: Clean wound with Normal Saline. Cleanse wound with mild soap and water May Shower, gently pat wound dry prior to applying new dressing. Anesthetic (add to Medication List): Wound #2 Right Knee: Topical Lidocaine 4% cream applied to wound bed prior to debridement (In Clinic Only). Hurricaine Topical Anesthetic Spray applied to wound bed prior to debridement (In Clinic Only). Skin Barriers/Peri-Wound Care: Wound #2 Right Knee: Skin Prep Secondary Dressing: Wound #2 Right Knee: Boardered Foam Dressing Dressing Change Frequency: Wound #2 Right Knee: Change dressing every week [name], LENAHAN. ([id number]) Follow-up Appointments: Wound #2 Right Knee: Return Appointment in 1 week. Off-Loading: Wound #2 Right Knee: Turn and reposition every 2 hours Additional Orders /  Instructions: Wound #2 Right Knee: Increase protein intake. Other: - vitamin C, zinc The following medication(s) was prescribed: lidocaine topical 4 % cream 1 1 cream topical was prescribed at facility 1. Continue with foam border to right knee 2. Left knee completely epithelialized 3. Follow-up next week Electronic Signature(s) Signed: 11/20/2017 11:11:25 AM By: Bonnell Public Entered By: Bonnell Public on 11/20/2017 11:11:24 [name], [name] -------------------------------------------------------------------------------- ROS/PFSH Details Patient Name: [name]. Date of Service: 11/20/2017 9:45 AM Medical Record Number: [id number] Patient Account Number: [id number] Date of Birth/Sex: Apr 29, 1920 (84 y.o. Female) Treating RN: Phillis Haggis Primary Care Provider: Dewaine Oats Other Clinician: Referring Provider: Dewaine Oats Treating Provider/Extender: Kathreen Cosier in Treatment: 1 Unable to Obtain Patient History due to oo Altered Mental Status Information Obtained From Patient Wound History Do you currently have one or more open woundso Yes How many open wounds do you currently haveo 2 Approximately how long have you had your woundso 3  weeks How have you been treating your wound(s) until nowo unknown Has your wound(s) ever healed and then re-openedo No Have you had any lab work done in the past montho No Have you tested positive for an antibiotic resistant organism (MRSA, VRE)o No Have you tested positive for osteomyelitis (bone infection)o No Have you had any tests for circulation on your legso No Cardiovascular Medical History: Positive for: Arrhythmia - a-fib; Coronary Artery Disease; Hypertension Neurologic Medical History: Positive for: Dementia Immunizations Pneumococcal Vaccine: Received Pneumococcal Vaccination: Yes Implantable Devices Family and Social History Marital Status - Widowed; Alcohol Use: Never; Drug Use: No History; Caffeine Use:  Daily; Financial Concerns: No; Food, Clothing or Shelter Needs: No; Support System Lacking: No; Transportation Concerns: No Physician Affirmation I have reviewed and agree with the above information. Electronic Signature(s) Signed: 11/20/2017 5:17:02 PM By: Bonnell Public Signed: 11/21/2017 4:55:35 PM By: Alejandro Mulling Entered By: Bonnell Public on 11/20/2017 11:10:20 [name], [name] ([id number]) -------------------------------------------------------------------------------- SuperBill Details Patient Name: [name]. Date of Service: 11/20/2017 Medical Record Number: [id number] Patient Account Number: [id number] Date of Birth/Sex: Sep 14, 1920 (84 y.o. Female) Treating RN: Phillis Haggis Primary Care Provider: Dewaine Oats Other Clinician: Referring Provider: Dewaine Oats Treating Provider/Extender: Kathreen Cosier in Treatment: 1 Diagnosis Coding ICD-10 Codes Code Description S80.211S Abrasion, right knee, sequela S80.212S Abrasion, left knee, sequela R54 Age-related physical debility I48.91 Unspecified atrial fibrillation E78.5 Hyperlipidemia, unspecified F03.90 Unspecified dementia without behavioral disturbance Facility Procedures CPT4 Code: [id number] Description: 99213 - WOUND CARE VISIT-LEV 3 EST PT Modifier: Quantity: 1 Physician Procedures CPT4 Code: 7846962 Description: 99213 - WC PHYS LEVEL 3 - EST PT ICD-10 Diagnosis Description S80.211S Abrasion, right knee, sequela S80.212S Abrasion, left knee, sequela F03.90 Unspecified dementia without behavioral disturbance Modifier: Quantity: 1 Electronic Signature(s) Signed: 11/20/2017 11:30:41 AM By: Alejandro Mulling Signed: 11/20/2017 5:17:02 PM By: Bonnell Public Previous Signature: 11/20/2017 11:12:25 AM Version By: Bonnell Public Entered By: Alejandro Mulling on 11/20/2017 11:30:40

## 2017-11-23 NOTE — Progress Notes (Signed)
[name], [name] ([id number]) Visit Report for 11/20/2017 Arrival Information Details Patient Name: [name], [name]. Date of Service: 11/20/2017 9:45 AM Medical Record Number: [id number] Patient Account Number: [id number] Date of Birth/Sex: 1920-04-24 (84 y.o. Female) Treating RN: [name], [name] Primary Care [name]: [name], [facility] Other Clinician: Referring [name]: [name] Treating [name]/Extender: [name] in Treatment: 1 Visit Information History Since Last Visit All ordered tests and consults were completed: No Patient Arrived: Wheel Chair Added or deleted any medications: No Arrival Time: 10:00 Any new allergies or adverse reactions: No Accompanied By: caregiver Had a fall or experienced change in No Transfer Assistance: EasyPivot Patient activities of daily living that may affect Lift risk of falls: Patient Identification Verified: Yes Signs or symptoms of abuse/neglect since last visito No Secondary Verification Process Yes Hospitalized since last visit: No Completed: Has Dressing in Place as Prescribed: Yes Patient Requires Transmission-Based No Precautions: Pain Present Now: No Patient Has Alerts: No Electronic Signature(s) Signed: 11/21/2017 4:55:35 PM By: [name] Entered By: [name] on 11/20/2017 10:01:28 [name] -------------------------------------------------------------------------------- Clinic Level of Care Assessment Details Patient Name: [name]. Date of Service: 11/20/2017 9:45 AM Medical Record Number: [id number] Patient Account Number: [id number] Date of Birth/Sex: August 25, 1920 (84 y.o. Female) Treating RN: [name], [name] Primary Care Avleen Bordwell: [name], [facility] Other Clinician: Referring [name]: [name] Treating [name]/Extender: [name] in Treatment: 1 Clinic Level of Care Assessment Items TOOL 4 Quantity Score X - Use when only an EandM is performed on FOLLOW-UP visit 1  0 ASSESSMENTS - Nursing Assessment / Reassessment X - Reassessment of Co-morbidities (includes updates in patient status) 1 10 X- 1 5 Reassessment of Adherence to Treatment Plan ASSESSMENTS - Wound and Skin Assessment / Reassessment []  - Simple Wound Assessment / Reassessment - one wound 0 X- 2 5 Complex Wound Assessment / Reassessment - multiple wounds []  - 0 Dermatologic / Skin Assessment (not related to wound area) ASSESSMENTS - Focused Assessment []  - Circumferential Edema Measurements - multi extremities 0 []  - 0 Nutritional Assessment / Counseling / Intervention []  - 0 Lower Extremity Assessment (monofilament, tuning fork, pulses) []  - 0 Peripheral Arterial Disease Assessment (using hand held doppler) ASSESSMENTS - Ostomy and/or Continence Assessment and Care []  - Incontinence Assessment and Management 0 []  - 0 Ostomy Care Assessment and Management (repouching, etc.) PROCESS - Coordination of Care X - Simple Patient / Family Education for ongoing care 1 15 []  - 0 Complex (extensive) Patient / Family Education for ongoing care []  - 0 Staff obtains Chiropractor, Records, Test Results / Process Orders []  - 0 Staff telephones HHA, Nursing Homes / Clarify orders / etc []  - 0 Routine Transfer to another Facility (non-emergent condition) []  - 0 Routine Hospital Admission (non-emergent condition) []  - 0 New Admissions / Manufacturing engineer / Ordering NPWT, Apligraf, etc. []  - 0 Emergency Hospital Admission (emergent condition) X- 1 10 Simple Discharge Coordination DOT, SPLINTER. ([id number]) []  - 0 Complex (extensive) Discharge Coordination PROCESS - Special Needs []  - Pediatric / Minor Patient Management 0 []  - 0 Isolation Patient Management []  - 0 Hearing / Language / Visual special needs []  - 0 Assessment of Community assistance (transportation, D/C planning, etc.) []  - 0 Additional assistance / Altered mentation []  - 0 Support Surface(s) Assessment (bed,  cushion, seat, etc.) INTERVENTIONS - Wound Cleansing / Measurement []  - Simple Wound Cleansing - one wound 0 X- 2 5 Complex Wound Cleansing - multiple wounds X- 1 5 Wound Imaging (photographs -  any number of wounds) []  - 0 Wound Tracing (instead of photographs) X- 1 5 Simple Wound Measurement - one wound []  - 0 Complex Wound Measurement - multiple wounds INTERVENTIONS - Wound Dressings X - Small Wound Dressing one or multiple wounds 1 10 []  - 0 Medium Wound Dressing one or multiple wounds []  - 0 Large Wound Dressing one or multiple wounds X- 1 5 Application of Medications - topical []  - 0 Application of Medications - injection INTERVENTIONS - Miscellaneous []  - External ear exam 0 []  - 0 Specimen Collection (cultures, biopsies, blood, body fluids, etc.) []  - 0 Specimen(s) / Culture(s) sent or taken to Lab for analysis []  - 0 Patient Transfer (multiple staff / Nurse, adult / Similar devices) []  - 0 Simple Staple / Suture removal (25 or less) []  - 0 Complex Staple / Suture removal (26 or more) []  - 0 Hypo / Hyperglycemic Management (close monitor of Blood Glucose) []  - 0 Ankle / Brachial Index (ABI) - do not check if billed separately X- 1 5 Vital Signs Miles, Jeanette P. ([id number]) Has the patient been seen at the hospital within the last three years: Yes Total Score: 90 Level Of Care: New/Established - Level 3 Electronic Signature(s) Signed: 11/21/2017 4:55:35 PM By: [name] Entered By: [name] on 11/20/2017 11:30:30 [name], [name] -------------------------------------------------------------------------------- Encounter Discharge Information Details Patient Name: [name]. Date of Service: 11/20/2017 9:45 AM Medical Record Number: [id number] Patient Account Number: [id number] Date of Birth/Sex: [birth year] (84 y.o. Female) Treating RN: [name], [name] Primary Care [name]: [name] Other Clinician: Referring Monicka Miles: [name] Treating Brycelyn Gambino/Extender: [name] in Treatment: 1 Encounter Discharge Information Items Discharge Pain Level: 0 Discharge Condition: Stable Ambulatory Status: Wheelchair Nursing Discharge Destination: Home Transportation: Private Auto Accompanied By: caregiver Schedule Follow-up Appointment: Yes Medication Reconciliation completed and No provided to Patient/Care Hezekiah Veltre: Clinical Summary of Care: Electronic Signature(s) Signed: 11/21/2017 4:55:35 PM By: [name] Entered By: [name] on 11/20/2017 10:24:37 [name], [name] ([id number]) -------------------------------------------------------------------------------- General Visit Notes Details Patient Name: [name]. Date of Service: 11/20/2017 9:45 AM Medical Record Number: [id number] Patient Account Number: [id number] Date of Birth/Sex: 1920-04-27 (84 y.o. Female) Treating RN: [name], [name] Primary Care Hargis Vandyne: [name] Other Clinician: Huel Coventry Jeanette Necha Harries: [name] Treating Zahki Hoogendoorn/Extender: [name] in Treatment: 1 Notes Today when pt came in today before she was seen, Silvio Pate called the pts POA Geryl Rankins and I got on the phone with her I asked her if she was indeed the Medical POA for the pt and she said yes and she was her niece as well. I asked her if it was okay to treat the pt and if needed debride the pts wounds and she said yes and Huel Coventry, RN was a witness to same. Eber Jones, [place] said that she would go by [facility] the pt resides and get a copy of the POA paper work and bring it by here for the pts chart so it can be on file. Electronic Signature(s) Signed: 11/20/2017 10:48:16 AM By: [name] Entered By: [name] on 11/20/2017 10:48:16 [place], [name] ([id number]) -------------------------------------------------------------------------------- Lower Extremity Assessment Details Patient Name: [name]. Date of Service: 11/20/2017 9:45 AM Medical Record Number: [id number] Patient Account Number: [id number] Date of Birth/Sex: June 22, 1920 (84 y.o. Female) Treating RN: Phillis Haggis Primary Care Davinder Haff: [name] Other Clinician: Referring Adonai Helzer: [name] Treating Alaria Oconnor/Extender: [name] in Treatment: 1 Electronic Signature(s) Signed: 11/21/2017  4:55:35 PM By: Jeanette MullingPinkerton, Debra Entered By: Jeanette MullingPinkerton, Debra on 11/20/2017 10:15:15 Creasey, [name] ([id number]) -------------------------------------------------------------------------------- Multi Wound Chart Details Patient Name: Jeanette AugustBLACK, Keisha P. Date of Service: 11/20/2017 9:45 AM Medical Record Number: [id number] Patient Account Number: [id number] Date of Birth/Sex: 11/11/1919 (84 y.o. Female) Treating RN: Jeanette CordiaPinkerton, [name] Primary Care Pansey Miles: [name] Other Clinician: Referring Makalia Bare: Jeanette OatsATE, DENNY Treating Brees Hounshell/Extender: Jeanette Cosieroulter, Leah Weeks in Treatment: 1 Vital Signs Height(in): Pulse(bpm): 72 Weight(lbs): Blood Pressure(mmHg): 147/73 Body Mass Index(BMI): Temperature(F): 97.6 Respiratory Rate 16 (breaths/min): Photos: [1:No Photos] [2:No Photos] [N/A:N/A] Wound Location: [1:Left Knee] [2:Right Knee] [N/A:N/A] Wounding Event: [1:Trauma] [2:Trauma] [N/A:N/A] Primary Etiology: [1:Trauma, Other] [2:Trauma, Other] [N/A:N/A] Comorbid History: [1:Arrhythmia, Coronary Artery Disease, Hypertension, Dementia] [2:Arrhythmia, Coronary Artery Disease, Hypertension, Dementia] [N/A:N/A] Date Acquired: [1:10/30/2017] [2:10/30/2017] [N/A:N/A] Weeks of Treatment: [1:1] [2:1] [N/A:N/A] Wound Status: [1:Open] [2:Open] [N/A:N/A] Clustered Wound: [1:Yes] [2:Yes] [N/A:N/A] Clustered Quantity: [1:2] [2:3] [N/A:N/A] Measurements L x W x D [1:0x0x0] [2:0.2x0.3x0.1] [N/A:N/A] (cm) Area (cm) : [1:0] [2:0.047] [N/A:N/A] Volume (cm) : [1:0] [2:0.005] [N/A:N/A] % Reduction in Area: [1:100.00%] [2:90.50%] [N/A:N/A] %  Reduction in Volume: [1:100.00%] [2:89.80%] [N/A:N/A] Classification: [1:Full Thickness Without Exposed Support Structures] [2:Full Thickness Without Exposed Support Structures] [N/A:N/A] Exudate Amount: [1:None Present] [2:Medium] [N/A:N/A] Exudate Type: [1:N/A] [2:Serosanguineous] [N/A:N/A] Exudate Color: [1:N/A] [2:red, brown] [N/A:N/A] Wound Margin: [1:Distinct, outline attached] [2:Distinct, outline attached] [N/A:N/A] Granulation Amount: [1:None Present (0%)] [2:Large (67-100%)] [N/A:N/A] Granulation Quality: [1:N/A] [2:Red] [N/A:N/A] Necrotic Amount: [1:None Present (0%)] [2:None Present (0%)] [N/A:N/A] Exposed Structures: [1:Fascia: No Fat Layer (Subcutaneous Tissue) Exposed: No Tendon: No Muscle: No Joint: No Bone: No] [2:Fascia: No Fat Layer (Subcutaneous Tissue) Exposed: No Tendon: No Muscle: No Joint: No Bone: No] [N/A:N/A] Epithelialization: [1:Large (67-100%)] [2:Large (67-100%)] [N/A:N/A] Periwound Skin Texture: [1:No Abnormalities Noted] [2:No Abnormalities Noted] [N/A:N/A] Periwound Skin Moisture: [1:No Abnormalities Noted] [2:No Abnormalities Noted] [N/A:N/A] Periwound Skin Color: No Abnormalities Noted No Abnormalities Noted N/A Temperature: No Abnormality No Abnormality N/A Tenderness on Palpation: Yes Yes N/A Wound Preparation: Ulcer Cleansing: Ulcer Cleansing: N/A Rinsed/Irrigated with Saline Rinsed/Irrigated with Saline Topical Anesthetic Applied: Topical Anesthetic Applied: None, Other: lidocaine 4% Other: lidocaine 4% Treatment Notes Wound #2 (Right Knee) 1. Cleansed with: Clean wound with Normal Saline 2. Anesthetic Topical Lidocaine 4% cream to wound bed prior to debridement 3. Peri-wound Care: Skin Prep 5. Secondary Dressing Applied Bordered Foam Dressing Electronic Signature(s) Signed: 11/20/2017 11:08:53 AM By: Bonnell Publicoulter, Leah Previous Signature: 11/20/2017 11:05:11 AM Version By: Bonnell Publicoulter, Leah Entered By: Bonnell Publicoulter, Leah on 11/20/2017 11:08:53 Jeanette CofferBLACK,  [name] ([id number]) -------------------------------------------------------------------------------- Multi-Disciplinary Care Plan Details Patient Name: Jeanette AugustBLACK, Allura P. Date of Service: 11/20/2017 9:45 AM Medical Record Number: [id number] Patient Account Number: [id number] Date of Birth/Sex: 11/05/1919 (84 y.o. Female) Treating RN: Jeanette CordiaPinkerton, [name] Primary Care Myson Levi: Jeanette OatsATE, DENNY Other Clinician: Referring Rushie Brazel: Jeanette OatsATE, DENNY Treating Marqui Formby/Extender: Jeanette Cosieroulter, Leah Weeks in Treatment: 1 Active Inactive ` Abuse / Safety / Falls / Self Care Management Nursing Diagnoses: History of Falls Potential for falls Goals: Patient will not experience any injury related to falls Date Initiated: 11/13/2017 Target Resolution Date: 03/14/2018 Goal Status: Active Patient will remain injury free related to falls Date Initiated: 11/13/2017 Target Resolution Date: 03/14/2018 Goal Status: Active Interventions: Assess Activities of Daily Living upon admission and as needed Assess fall risk on admission and as needed Assess: immobility, friction, shearing, incontinence upon admission and as needed Assess impairment of mobility on admission and as needed per policy Assess personal safety and home safety (as indicated) on admission and as needed Assess self care needs on  admission and as needed Notes: ` Nutrition Nursing Diagnoses: Imbalanced nutrition Potential for alteratiion in Nutrition/Potential for imbalanced nutrition Goals: Patient/caregiver agrees to and verbalizes understanding of need to use nutritional supplements and/or vitamins as prescribed Date Initiated: 11/13/2017 Target Resolution Date: 03/07/2018 Goal Status: Active Interventions: Assess patient nutrition upon admission and as needed per policy Notes: [name], [name] ([id number]) Orientation to the Wound Care Program Nursing Diagnoses: Knowledge deficit related to the wound healing center program Goals: Patient/caregiver  will verbalize understanding of the Wound Healing Center Program Date Initiated: 11/13/2017 Target Resolution Date: 12/13/2017 Goal Status: Active Interventions: Provide education on orientation to the wound center Notes: ` Pain, Acute or Chronic Nursing Diagnoses: Pain, acute or chronic: actual or potential Potential alteration in comfort, pain Goals: Patient/caregiver will verbalize adequate pain control between visits Date Initiated: 11/13/2017 Target Resolution Date: 03/14/2018 Goal Status: Active Interventions: Complete pain assessment as per visit requirements Notes: ` Wound/Skin Impairment Nursing Diagnoses: Impaired tissue integrity Knowledge deficit related to ulceration/compromised skin integrity Goals: Ulcer/skin breakdown will have a volume reduction of 80% by week 12 Date Initiated: 11/13/2017 Target Resolution Date: 03/07/2018 Goal Status: Active Interventions: Assess patient/caregiver ability to perform ulcer/skin care regimen upon admission and as needed Assess ulceration(s) every visit Notes: Electronic Signature(s) Signed: 11/21/2017 4:55:35 PM By: [name] Entered By: [name] on 11/20/2017 10:15:28 [name] ([id number]) [name], [name] -------------------------------------------------------------------------------- Pain Assessment Details Patient Name: [name]. Date of Service: 11/20/2017 9:45 AM Medical Record Number: [id number] Patient Account Number: [id number] Date of Birth/Sex: 11/13/1919 (84 y.o. Female) Treating RN: [name], [name] Primary Care Solara Goodchild: [name] Other Clinician: Referring Rielly Brunn: [name] Treating Vue Pavon/Extender: [name] in Treatment: 1 Active Problems Location of Pain Severity and Description of Pain Patient Has Paino No Site Locations Pain Management and Medication Current Pain Management: Electronic Signature(s) Signed: 11/21/2017 4:55:35 PM By: [name] Entered By: [name] on 11/20/2017 10:01:37 [name] ([id number]) -------------------------------------------------------------------------------- Patient/Caregiver Education Details Patient Name: [name]. Date of Service: 11/20/2017 9:45 AM Medical Record Number: [id number] Patient Account Number: [id number] Date of Birth/Gender: 09/07/1920 (84 y.o. Female) Treating RN: Phillis Haggis Primary Care Physician: [name], [facility] Other Clinician: Referring Physician: Dewaine Miles Treating Physician/Extender: [name] in Treatment: 1 Education Assessment Education Provided To: Patient Education Topics Provided Wound/Skin Impairment: Handouts: Caring for Your Ulcer, Other: change dressing as ordered Methods: Demonstration, Explain/Verbal Responses: State content correctly Electronic Signature(s) Signed: 11/21/2017 4:55:35 PM By: [name] Entered By: [name] on 11/20/2017 10:24:53 [name], [name] ([id number]) -------------------------------------------------------------------------------- Wound Assessment Details Patient Name: [name]. Date of Service: 11/20/2017 9:45 AM Medical Record Number: [id number] Patient Account Number: [id number] Date of Birth/Sex: 06-18-1920 (84 y.o. Female) Treating RN: [name], [name] Primary Care Matther Labell: [name], Katherina Right Other Clinician: Referring Lytle Malburg: [name] Treating Shantoria Ellwood/Extender: [name] in Treatment: 1 Wound Status Wound Number: 1 Primary Trauma, Other Etiology: Wound Location: Left Knee Wound Status: Open Wounding Event: Trauma Comorbid Arrhythmia, Coronary Artery Disease, Date Acquired: 10/30/2017 History: Hypertension, Dementia Weeks Of Treatment: 1 Clustered Wound: Yes Photos Photo Uploaded By: [name] on 11/20/2017 13:24:31 Wound Measurements Length: (cm) 0 % Re Width: (cm) 0 % Re Depth: (cm) 0 Epit Clustered Quantity: 2 Tunn Area: (cm) 0  Und Volume: (cm) 0 duction in Area: 100% duction in Volume: 100% helialization: Large (67-100%) eling: No ermining: No Wound Description Full Thickness Without Exposed Support Classification: Structures Wound Margin: Distinct, outline attached Exudate None Present Amount: Foul Odor  After Cleansing: No Slough/Fibrino No Wound Bed Granulation Amount: None Present (0%) Exposed Structure Necrotic Amount: None Present (0%) Fascia Exposed: No Fat Layer (Subcutaneous Tissue) Exposed: No Tendon Exposed: No Muscle Exposed: No Joint Exposed: No Bone Exposed: No Periwound Skin Texture Miles, Jeanette P. ([id number]) Texture Color No Abnormalities Noted: No No Abnormalities Noted: No Moisture Temperature / Pain No Abnormalities Noted: No Temperature: No Abnormality Tenderness on Palpation: Yes Wound Preparation Ulcer Cleansing: Rinsed/Irrigated with Saline Topical Anesthetic Applied: None, Other: lidocaine 4%, Electronic Signature(s) Signed: 11/21/2017 4:55:35 PM By: [name] Entered By: [name] on 11/20/2017 10:11:02 Mcmurry, [name] -------------------------------------------------------------------------------- Wound Assessment Details Patient Name: [name]. Date of Service: 11/20/2017 9:45 AM Medical Record Number: [id number] Patient Account Number: [id number] Date of Birth/Sex: 10-05-1920 (84 y.o. Female) Treating RN: [name], [name] Primary Care Orrie Schubert: [name], Katherina Right Other Clinician: Referring Shalaine Payson: [name] Treating Vegas Coffin/Extender: [name] in Treatment: 1 Wound Status Wound Number: 2 Primary Trauma, Other Etiology: Wound Location: Right Knee Wound Status: Open Wounding Event: Trauma Comorbid Arrhythmia, Coronary Artery Disease, Date Acquired: 10/30/2017 History: Hypertension, Dementia Weeks Of Treatment: 1 Clustered Wound: Yes Photos Photo Uploaded By: [name] on 11/20/2017 13:24:31 Wound  Measurements Length: (cm) 0.2 Width: (cm) 0.3 Depth: (cm) 0.1 Clustered Quantity: 3 Area: (cm) 0.047 Volume: (cm) 0.005 % Reduction in Area: 90.5% % Reduction in Volume: 89.8% Epithelialization: Large (67-100%) Tunneling: No Undermining: No Wound Description Full Thickness Without Exposed Support Classification: Structures Wound Margin: Distinct, outline attached Exudate Medium Amount: Exudate Type: Serosanguineous Exudate Color: red, brown Foul Odor After Cleansing: No Slough/Fibrino No Wound Bed Granulation Amount: Large (67-100%) Exposed Structure Granulation Quality: Red Fascia Exposed: No Necrotic Amount: None Present (0%) Fat Layer (Subcutaneous Tissue) Exposed: No Tendon Exposed: No Muscle Exposed: No Joint Exposed: No Miles, Jeanette P. ([id number]) Bone Exposed: No Periwound Skin Texture Texture Color No Abnormalities Noted: No No Abnormalities Noted: No Moisture Temperature / Pain No Abnormalities Noted: No Temperature: No Abnormality Tenderness on Palpation: Yes Wound Preparation Ulcer Cleansing: Rinsed/Irrigated with Saline Topical Anesthetic Applied: Other: lidocaine 4%, Treatment Notes Wound #2 (Right Knee) 1. Cleansed with: Clean wound with Normal Saline 2. Anesthetic Topical Lidocaine 4% cream to wound bed prior to debridement 3. Peri-wound Care: Skin Prep 5. Secondary Dressing Applied Bordered Foam Dressing Electronic Signature(s) Signed: 11/21/2017 4:55:35 PM By: [name] Entered By: [name] on 11/20/2017 10:13:48 Vardaman, [name] ([id number]) -------------------------------------------------------------------------------- Vitals Details Patient Name: [name]. Date of Service: 11/20/2017 9:45 AM Medical Record Number: [id number] Patient Account Number: [id number] Date of Birth/Sex: 1920/08/03 (84 y.o. Female) Treating RN: [name], [name] Primary Care Jadier Rockers: [name], [facility] Other Clinician: Referring Rakeen Gaillard:  [name] Treating Armoni Depass/Extender: [name] in Treatment: 1 Vital Signs Time Taken: 10:01 Temperature (F): 97.6 Pulse (bpm): 72 Respiratory Rate (breaths/min): 16 Blood Pressure (mmHg): 147/73 Reference Range: 80 - 120 mg / dl Electronic Signature(s) Signed: 11/21/2017 4:55:35 PM By: [name] Entered By: [name] on 11/20/2017 10:02:55

## 2017-11-27 ENCOUNTER — Encounter: Payer: Medicare Other | Admitting: Nurse Practitioner

## 2017-11-27 DIAGNOSIS — S80211A Abrasion, right knee, initial encounter: Secondary | ICD-10-CM | POA: Diagnosis not present

## 2017-11-29 NOTE — Progress Notes (Signed)
[name], Jeanette P. ([id number]) Visit Report for 11/27/2017 Chief Complaint Document Details Patient Name: [name], Jeanette P. Date of Service: 11/27/2017 10:00 AM Medical Record Number: [id number] Patient Account Number: [id number] Date of Birth/Sex: 09/21/1920 (84 y.o. Female) Treating RN: Phillis HaggisPinkerton, Debi Primary Care Provider: Dewaine OatsATE, DENNY Other Clinician: Referring Provider: Dewaine OatsATE, DENNY Treating Provider/Extender: Kathreen Cosieroulter, Deionna Marcantonio Weeks in Treatment: 2 Information Obtained from: Patient Chief Complaint She is here for evaluation of bilateral knee wounds Electronic Signature(s) Signed: 11/27/2017 10:30:06 AM By: Bonnell Publicoulter, Sabir Charters Entered By: Bonnell Publicoulter, Mindy Behnken on 11/27/2017 10:30:06 [name], Jeanette P. ([id number]) -------------------------------------------------------------------------------- HPI Details Patient Name: [name], Jeanette P. Date of Service: 11/27/2017 10:00 AM Medical Record Number: [id number] Patient Account Number: [id number] Date of Birth/Sex: 05/03/1920 (84 y.o. Female) Treating RN: Phillis HaggisPinkerton, Debi [facility] Other Clinician: Referring Provider: Dewaine OatsATE, DENNY Treating Provider/Extender: Kathreen Cosieroulter, Aleksandr Pellow Weeks in Treatment: 2 History of Present Illness Location: bilateral knees Quality: pt unable to qualify r/t dementia Severity: pt unable to quantify r/t dementia Duration: approximately one month (January 2018) Context: s/p fall, traumatic injury HPI Description: 11/13/17 patient is here for initial evaluation for bilateral knee traumatic injury. Information is obtained from outside medical records and personal companion. She sustained a fall 3-4 weeks ago, likely slipped while transferring from her wheelchair to the commode and landed on her knees; she has been less mobile since then per companion. According to the companion there has been no topical treatment that she is aware of, although she is not responsible for bathing, dressing, performing/assisting ADLs. Patient  resides in [facility] assisted living, does not have home health, has private duty companionship from [employer] 8 hours a day (8-12:30 and 13:30-17:30). Patient is unable to provide any specific information or details. She last saw primary care yesterday where she was referred to walk-in clinic for x-ray secondary to persistent pain and referred to wound care center 11/20/17-she is here in follow-up evaluation for bilateral knee injuries. 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 The right knee has a small amount of partial-thickness opening and anticipate that will be healed at next week's appointment. We will continue with current treatment plan a follow-up next week. 11/27/17-she is here in follow up evaluation for right knee wound. She is healed and can be discharged from clinic. Her Private duty companion is inquiring about her lower extremity edema and weeping. She has been advised to contact her family to have an appointment with primary care for compression therapy, she has also been encouraged to have the patient elevate her legs while they are with her throughout the day Electronic Signature(s) Signed: 11/27/2017 10:32:37 AM By: Bonnell Publicoulter, Teagyn Fishel Entered By: Bonnell Publicoulter, Azrael Maddix on 11/27/2017 10:32:37 [name], Jeanette P. ([id number]) -------------------------------------------------------------------------------- Physician Orders Details Patient Name: [name], Jeanette P. Date of Service: 11/27/2017 10:00 AM Medical Record Number: [id number] Patient Account Number: [id number] Date of Birth/Sex: 01/12/1920 (84 y.o. Female) Treating RN: Curtis Sitesorthy, Joanna Primary Care Provider: Dewaine OatsATE, DENNY Other Clinician: Referring Provider: Dewaine OatsATE, DENNY Treating Provider/Extender: Kathreen Cosieroulter, Brason Berthelot Weeks in Treatment: 2 Verbal / Phone Orders: No Diagnosis [facility]oding [facility] Discharge from Wound Care Center Electronic Signature(s) Signed: 11/27/2017 10:33:06 AM By: Bonnell Publicoulter, Shatira Dobosz Entered By: Bonnell Publicoulter,  Maralee Higuchi on 11/27/2017 10:33:06 SnowflakeBLACK, [name] ([id number]) -------------------------------------------------------------------------------- Problem List Details Patient Name: [name], Jeanette P. Date of Service: 11/27/2017 10:00 AM Medical Record Number: [id number] Patient Account Number: [id number] Date of Birth/Sex: 12/09/1919 (84 y.o. Female) Treating RN: Ashok CordiaPinkerton, Debi Primary Care Provider: Dewaine OatsATE, DENNY Other Clinician: Referring Provider: Dewaine OatsATE, DENNY Treating Provider/Extender: Bonnell Publicoulter, Abhishek Levesque  Weeks in Treatment: 2 Active Problems ICD-10 Encounter Code Description Active Date Diagnosis S80.211S Abrasion, right knee, sequela 11/13/2017 Yes S80.212S Abrasion, left knee, sequela 11/13/2017 Yes R54 Age-related physical debility 11/13/2017 Yes I48.91 Unspecified atrial fibrillation 11/13/2017 Yes E78.5 Hyperlipidemia, unspecified 11/13/2017 Yes F03.90 Unspecified dementia without behavioral disturbance 11/13/2017 Yes Inactive Problems Resolved Problems Electronic Signature(s) Signed: 11/27/2017 10:22:20 AM By: Bonnell Public Entered By: Bonnell Public on 11/27/2017 10:22:20 [name] ([id number]) -------------------------------------------------------------------------------- Progress Note Details Patient Name: [name]. Date of Service: 11/27/2017 10:00 AM Medical Record Number: [id number] Patient Account Number: [id number] Date of Birth/Sex: [birth year] (84 y.o. Female) Treating RN: Ashok Cordia, Debi Primary Care Provider: TATE, [facility] Other Clinician: Referring Provider: Dewaine Oats Treating Provider/Extender: Kathreen Cosier in Treatment: 2 Subjective Chief Complaint Information obtained from Patient She is here for evaluation of bilateral knee wounds History of Present Illness (HPI) The following HPI elements were documented for the patient's wound: Location: bilateral knees Quality: pt unable to qualify r/t dementia Severity: pt unable to quantify r/t dementia Duration:  approximately one month (January 2018) Context: s/p fall, traumatic injury 11/13/17 patient is here for initial evaluation for bilateral knee traumatic injury. Information is obtained from outside medical records and personal companion. She sustained a fall 3-4 weeks ago, likely slipped while transferring from her wheelchair to the commode and landed on her knees; she has been less mobile since then per companion. According to the companion there has been no topical treatment that she is aware of, although she is not responsible for bathing, dressing, performing/assisting ADLs. Patient resides in [facility] assisted living, does not have home health, has private duty companionship from [facility] [employer] 8 hours a day (8-12:30 and 13:30-17:30). Patient is unable to provide any specific information or details. She last saw primary care yesterday where she was referred to walk-in clinic for x-ray secondary to persistent pain and referred to wound care center 11/20/17-she is here in follow-up evaluation for bilateral knee injuries. 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 The right knee has a small amount of partial-thickness opening and anticipate that will be healed at next week's appointment. We will continue with current treatment plan a follow-up next week. 11/27/17-she is here in follow up evaluation for right knee wound. She is healed and can be discharged from clinic. Her Private duty companion is inquiring about her lower extremity edema and weeping. She has been advised to contact her family to have an appointment with primary care for compression therapy, she has also been encouraged to have the patient elevate her legs while they are with her throughout the day Patient History Unable to Obtain Patient History due to Altered Mental Status. Information obtained from Patient. Social History Marital Status - Widowed, Alcohol Use - Never, Drug Use - No History, Caffeine Use - Daily. EVELINE, SAUVE ([id number]) Objective Constitutional Vitals Time Taken: 9:53 AM, Temperature: 97.8 F, Pulse: 66 bpm, Respiratory Rate: 16 breaths/min, Blood Pressure: 139/60 mmHg. Integumentary (Hair, Skin) Wound #2 status is Healed - Epithelialized. Original cause of wound was Trauma. The wound is located on the Right Knee. The wound measures 0cm length x 0cm width x 0cm depth; 0cm^2 area and 0cm^3 volume. Assessment Active Problems ICD-10 S80.211S - Abrasion, right knee, sequela S80.212S - Abrasion, left knee, sequela R54 - Age-related physical debility I48.91 - Unspecified atrial fibrillation E78.5 - Hyperlipidemia, unspecified F03.90 - Unspecified dementia without behavioral disturbance Plan [facility]charge From [facility]: Discharge from Wound Care Center discharge from wound care center-healed Electronic  Signature(s) Signed: 11/27/2017 10:33:27 AM By: Bonnell Public Entered By: Bonnell Public on 11/27/2017 10:33:27 [name] ([id number]) -------------------------------------------------------------------------------- ROS/PFSH Details Patient Name: [name]. Date of Service: 11/27/2017 10:00 AM Medical Record Number: [id number] Patient Account Number: [id number] Date of Birth/Sex: Sep 15, 1920 (84 y.o. Female) Treating RN: Ashok Cordia, Debi Primary Care Provider: TATE, [facility] Other Clinician: Referring Provider: Dewaine Oats Treating Provider/Extender: Kathreen Cosier in Treatment: 2 Unable to Obtain Patient History due to oo Altered Mental Status Information Obtained From Patient Wound History Do you currently have one or more open woundso Yes How many open wounds do you currently haveo 2 Approximately how long have you had your woundso 3 weeks How have you been treating your wound(s) until nowo unknown Has your wound(s) ever healed and then re-openedo No Have you had any lab work done in the past montho No Have you tested positive for an antibiotic resistant organism (MRSA,  VRE)o No Have you tested positive for osteomyelitis (bone infection)o No Have you had any tests for circulation on your legso No Cardiovascular Medical History: Positive for: Arrhythmia - a-fib; Coronary Artery Disease; Hypertension Neurologic Medical History: Positive for: Dementia Immunizations Pneumococcal Vaccine: Received Pneumococcal Vaccination: Yes Implantable Devices Family and Social History Marital Status - Widowed; Alcohol Use: Never; Drug Use: No History; Caffeine Use: Daily; Financial Concerns: No; Food, Clothing or Shelter Needs: No; Support System Lacking: No; Transportation Concerns: No Physician Affirmation I have reviewed and agree with the above information. Electronic Signature(s) Signed: 11/27/2017 5:45:09 PM By: Bonnell Public Signed: 11/28/2017 8:00:35 AM By: Alejandro Mulling Entered By: Bonnell Public on 11/27/2017 10:32:52 Lakshmi, Sundeen Jeanette Singleton ([id number]) -------------------------------------------------------------------------------- SuperBill Details Patient Name: [name]. Date of Service: 11/27/2017 Medical Record Number: [id number] Patient Account Number: [id number] Date of Birth/Sex: [birth year] (84 y.o. Female) Treating RN: Phillis Haggis Primary Care Provider: Dewaine Oats Other Clinician: Referring Provider: Dewaine Oats Treating Provider/Extender: Kathreen Cosier in Treatment: 2 Diagnosis Coding ICD-10 Codes Code Description S80.211S Abrasion, right knee, sequela S80.212S Abrasion, left knee, sequela R54 Age-related physical debility I48.91 Unspecified atrial fibrillation E78.5 Hyperlipidemia, unspecified F03.90 Unspecified dementia without behavioral disturbance Facility Procedures CPT4 Code: 62952841 Description: 32440 - WOUND CARE VISIT-LEV 2 EST PT Modifier: Quantity: 1 Physician Procedures CPT4 Code: [id number] Description: 66440 - WC PHYS LEVEL 2 - EST PT ICD-10 Diagnosis Description S80.211S Abrasion, right knee,  sequela Modifier: Quantity: 1 Electronic Signature(s) Signed: 11/27/2017 10:34:03 AM By: Bonnell Public Entered By: Bonnell Public on 11/27/2017 10:34:03

## 2017-11-29 NOTE — Progress Notes (Signed)
AMELIA, BURGARD ([id number]) Visit Report for 11/27/2017 Arrival Information Details Patient Name: RATEEL, BELDIN. Date of Service: 11/27/2017 10:00 AM Medical Record Number: [id number] Patient Account Number: [id number] Date of Birth/Sex: March 20, 1920 (84 y.o. Female) Treating RN: Ashok Cordia, Debi Primary Care Latarra Eagleton: Dewaine Oats Other Clinician: Phillis Haggis Referring Eimi Viney: Dewaine Oats Treating Gisella Alwine/Extender: Kathreen Cosier in Treatment: 2 Visit Information History Since Last Visit Added or deleted any medications: No Patient Arrived: Wheel Chair Any new allergies or adverse reactions: No Arrival Time: 09:49 Had a fall or experienced change in No activities of daily living that may affect Accompanied By: staff risk of falls: Transfer Assistance: Manual Signs or symptoms of abuse/neglect since last visito No Patient Identification Verified: Yes Hospitalized since last visit: No Secondary Verification Process Completed: Yes Has Dressing in Place as Prescribed: Yes Patient Requires Transmission-Based No Pain Present Now: No Precautions: Patient Has Alerts: No Electronic Signature(s) Signed: 11/27/2017 5:33:45 PM By: Curtis Sites Entered By: Curtis Sites on 11/27/2017 09:53:36 Clute, Leona Singleton ([id number]) -------------------------------------------------------------------------------- Clinic Level of Care Assessment Details Patient Name: Janann August. Date of Service: 11/27/2017 10:00 AM Medical Record Number: [id number] Patient Account Number: [id number] Date of Birth/Sex: 01-07-1920 (84 y.o. Female) Treating RN: Curtis Sites Primary Care Rehman Levinson: TATE, Katherina Right Other Clinician: Referring Webber Michiels: Dewaine Oats Treating Abbye Lao/Extender: Kathreen Cosier in Treatment: 2 Clinic Level of Care Assessment Items TOOL 4 Quantity Score []  - Use when only an EandM is performed on FOLLOW-UP visit 0 ASSESSMENTS - Nursing Assessment / Reassessment X -  Reassessment of Co-morbidities (includes updates in patient status) 1 10 X- 1 5 Reassessment of Adherence to Treatment Plan ASSESSMENTS - Wound and Skin Assessment / Reassessment X - Simple Wound Assessment / Reassessment - one wound 1 5 []  - 0 Complex Wound Assessment / Reassessment - multiple wounds []  - 0 Dermatologic / Skin Assessment (not related to wound area) ASSESSMENTS - Focused Assessment []  - Circumferential Edema Measurements - multi extremities 0 []  - 0 Nutritional Assessment / Counseling / Intervention X- 1 5 Lower Extremity Assessment (monofilament, tuning fork, pulses) []  - 0 Peripheral Arterial Disease Assessment (using hand held doppler) ASSESSMENTS - Ostomy and/or Continence Assessment and Care []  - Incontinence Assessment and Management 0 []  - 0 Ostomy Care Assessment and Management (repouching, etc.) PROCESS - Coordination of Care X - Simple Patient / Family Education for ongoing care 1 15 []  - 0 Complex (extensive) Patient / Family Education for ongoing care []  - 0 Staff obtains Chiropractor, Records, Test Results / Process Orders []  - 0 Staff telephones HHA, Nursing Homes / Clarify orders / etc []  - 0 Routine Transfer to another Facility (non-emergent condition) []  - 0 Routine Hospital Admission (non-emergent condition) []  - 0 New Admissions / Manufacturing engineer / Ordering NPWT, Apligraf, etc. []  - 0 Emergency Hospital Admission (emergent condition) X- 1 10 Simple Discharge Coordination VINCENZINA, JAGODA. ([id number]) []  - 0 Complex (extensive) Discharge Coordination PROCESS - Special Needs []  - Pediatric / Minor Patient Management 0 []  - 0 Isolation Patient Management []  - 0 Hearing / Language / Visual special needs []  - 0 Assessment of Community assistance (transportation, D/C planning, etc.) []  - 0 Additional assistance / Altered mentation []  - 0 Support Surface(s) Assessment (bed, cushion, seat, etc.) INTERVENTIONS - Wound Cleansing /  Measurement X - Simple Wound Cleansing - one wound 1 5 []  - 0 Complex Wound Cleansing - multiple wounds X- 1 5 Wound Imaging (photographs - any number of wounds) []  - 0  Wound Tracing (instead of photographs) X- 1 5 Simple Wound Measurement - one wound []  - 0 Complex Wound Measurement - multiple wounds INTERVENTIONS - Wound Dressings []  - Small Wound Dressing one or multiple wounds 0 []  - 0 Medium Wound Dressing one or multiple wounds []  - 0 Large Wound Dressing one or multiple wounds []  - 0 Application of Medications - topical []  - 0 Application of Medications - injection INTERVENTIONS - Miscellaneous []  - External ear exam 0 []  - 0 Specimen Collection (cultures, biopsies, blood, body fluids, etc.) []  - 0 Specimen(s) / Culture(s) sent or taken to Lab for analysis []  - 0 Patient Transfer (multiple staff / Nurse, adult / Similar devices) []  - 0 Simple Staple / Suture removal (25 or less) []  - 0 Complex Staple / Suture removal (26 or more) []  - 0 Hypo / Hyperglycemic Management (close monitor of Blood Glucose) []  - 0 Ankle / Brachial Index (ABI) - do not check if billed separately X- 1 5 Vital Signs Norden, Easton P. ([id number]) Has the patient been seen at the hospital within the last three years: Yes Total Score: 70 Level Of Care: New/Established - Level 2 Electronic Signature(s) Signed: 11/28/2017 8:00:35 AM By: Alejandro Mulling Entered By: Alejandro Mulling on 11/27/2017 17:17:44 Apfel, Leona Singleton ([id number]) -------------------------------------------------------------------------------- Encounter Discharge Information Details Patient Name: Janann August. Date of Service: 11/27/2017 10:00 AM Medical Record Number: [id number] Patient Account Number: [id number] Date of Birth/Sex: Apr 04, 1920 (84 y.o. Female) Treating RN: Curtis Sites Primary Care Jaydan Meidinger: Dewaine Oats Other Clinician: Referring Lona Six: Dewaine Oats Treating Ramesh Moan/Extender: Kathreen Cosier in  Treatment: 2 Encounter Discharge Information Items Discharge Pain Level: 0 Discharge Condition: Stable Ambulatory Status: Wheelchair Discharge Destination: Home Private Transportation: Auto Accompanied By: staff Schedule Follow-up Appointment: No Medication Reconciliation completed and provided No to Patient/Care Endia Moncur: Clinical Summary of Care: Electronic Signature(s) Signed: 11/27/2017 5:33:45 PM By: Curtis Sites Entered By: Curtis Sites on 11/27/2017 10:14:53 Nakayama, Leona Singleton ([id number]) -------------------------------------------------------------------------------- Multi Wound Chart Details Patient Name: Janann August. Date of Service: 11/27/2017 10:00 AM Medical Record Number: [id number] Patient Account Number: [id number] Date of Birth/Sex: May 24, 1920 (84 y.o. Female) Treating RN: Ashok Cordia, Debi Primary Care Liddy Deam: Dewaine Oats Other Clinician: Referring Yandiel Bergum: Dewaine Oats Treating Tameshia Bonneville/Extender: Kathreen Cosier in Treatment: 2 Vital Signs Height(in): Pulse(bpm): 66 Weight(lbs): Blood Pressure(mmHg): 139/60 Body Mass Index(BMI): Temperature(F): 97.8 Respiratory Rate 16 (breaths/min): Photos: [N/A:N/A] Wound Location: Right Knee N/A N/A Wounding Event: Trauma N/A N/A Primary Etiology: Trauma, Other N/A N/A Date Acquired: 10/30/2017 N/A N/A Weeks of Treatment: 2 N/A N/A Wound Status: Healed - Epithelialized N/A N/A Clustered Wound: Yes N/A N/A Measurements L x W x D 0x0x0 N/A N/A (cm) Area (cm) : 0 N/A N/A Volume (cm) : 0 N/A N/A % Reduction in Area: 100.00% N/A N/A % Reduction in Volume: 100.00% N/A N/A Classification: Full Thickness Without N/A N/A Exposed Support Structures Periwound Skin Texture: No Abnormalities Noted N/A N/A Periwound Skin Moisture: No Abnormalities Noted N/A N/A Periwound Skin Color: No Abnormalities Noted N/A N/A Tenderness on Palpation: No N/A N/A Treatment Notes Electronic Signature(s) Signed:  11/27/2017 5:17:12 PM By: Alejandro Mulling Previous Signature: 11/27/2017 10:22:25 AM Version By: Bonnell Public Entered By: Alejandro Mulling on 11/27/2017 17:17:12 Kotter, Leona Singleton ([id number] Coffer, Leona Singleton ([id number]) -------------------------------------------------------------------------------- Multi-Disciplinary Care Plan Details Patient Name: Janann August. Date of Service: 11/27/2017 10:00 AM Medical Record Number: [id number] Patient Account Number: [id number] Date of Birth/Sex: [birth year] (84 y.o. Female) Treating RN: Curtis Sites Primary Care Brionne Mertz: [facility]  [facility] Other Clinician: Referring Shamari Lofquist: TATE, Katherina RightENNY Treating Justiss Gerbino/Extender: Kathreen Cosieroulter, Leah Weeks in Treatment: 2 Active Inactive Electronic Signature(s) Signed: 11/27/2017 5:17:06 PM By: Alejandro MullingPinkerton, Debra Signed: 11/27/2017 5:33:45 PM By: Curtis Sitesorthy, Joanna Entered By: Alejandro MullingPinkerton, Debra on 11/27/2017 17:17:05 Ashford, Leona SingletonWINONA P. ([id number]) -------------------------------------------------------------------------------- Pain Assessment Details Patient Name: Janann AugustBLACK, Noella P. Date of Service: 11/27/2017 10:00 AM Medical Record Number: [id number] Patient Account Number: [id number] Date of Birth/Sex: 03/05/1920 (84 y.o. Female) Treating RN: Curtis Sitesorthy, Joanna Primary Care Laurence Folz: Dewaine OatsATE, DENNY Other Clinician: Referring Lasondra Hodgkins: Dewaine OatsATE, DENNY Treating Zack Crager/Extender: Kathreen Cosieroulter, Leah Weeks in Treatment: 2 Active Problems Location of Pain Severity and Description of Pain Patient Has Paino No Site Locations Pain Management and Medication Current Pain Management: Notes Topical or injectable lidocaine is offered to patient for acute pain when surgical debridement is performed. If needed, Patient is instructed to use over the counter pain medication for the following 24-48 hours after debridement. Wound care MDs do not prescribed pain medications. Patient has chronic pain or uncontrolled pain. Patient has been instructed to make an  appointment with their Primary Care Physician for pain management. Electronic Signature(s) Signed: 11/27/2017 5:33:45 PM By: Curtis Sitesorthy, Joanna Entered By: Curtis Sitesorthy, Joanna on 11/27/2017 09:53:44 Knoebel, Leona SingletonWINONA P. ([id number]) -------------------------------------------------------------------------------- Patient/Caregiver Education Details Patient Name: Janann AugustBLACK, Bonne P. Date of Service: 11/27/2017 10:00 AM Medical Record Number: [id number] Patient Account Number: [id number] Date of Birth/Gender: 06/19/1920 (84 y.o. Female) Treating RN: Curtis Sitesorthy, Joanna Primary Care Physician: Dewaine OatsATE, DENNY Other Clinician: Referring Physician: Dewaine OatsATE, DENNY Treating Physician/Extender: Kathreen Cosieroulter, Leah Weeks in Treatment: 2 Education Assessment Education Provided To: Patient and Caregiver Education Topics Provided Basic Hygiene: Handouts: Other: care of newly healed ulcer site Methods: Explain/Verbal Responses: State content correctly Electronic Signature(s) Signed: 11/27/2017 5:33:45 PM By: Curtis Sitesorthy, Joanna Entered By: Curtis Sitesorthy, Joanna on 11/27/2017 10:15:20 Cephas, Leona SingletonWINONA P. ([id number]) -------------------------------------------------------------------------------- Wound Assessment Details Patient Name: Janann AugustBLACK, Amra P. Date of Service: 11/27/2017 10:00 AM Medical Record Number: [id number] Patient Account Number: [id number] Date of Birth/Sex: 12/04/1919 (84 y.o. Female) Treating RN: Curtis Sitesorthy, Joanna Primary Care Marleigh Kaylor: Dewaine OatsATE, DENNY Other Clinician: Referring Andriel Omalley: Dewaine OatsATE, DENNY Treating Yaslene Lindamood/Extender: Kathreen Cosieroulter, Leah Weeks in Treatment: 2 Wound Status Wound Number: 2 Primary Etiology: Trauma, Other Wound Location: Right Knee Wound Status: Healed - Epithelialized Wounding Event: Trauma Date Acquired: 10/30/2017 Weeks Of Treatment: 2 Clustered Wound: Yes Photos Photo Uploaded By: Curtis Sitesorthy, Joanna on 11/27/2017 10:18:47 Wound Measurements Length: (cm) 0 Width: (cm) 0 Depth: (cm) 0 Area: (cm) 0 Volume:  (cm) 0 % Reduction in Area: 100% % Reduction in Volume: 100% Wound Description Full Thickness Without Exposed Support Classification: Structures Periwound Skin Texture Texture Color No Abnormalities Noted: No No Abnormalities Noted: No Moisture No Abnormalities Noted: No Electronic Signature(s) Signed: 11/27/2017 5:33:45 PM By: Curtis Sitesorthy, Joanna Entered By: Curtis Sitesorthy, Joanna on 11/27/2017 10:06:30 Janann AugustBLACK, Elfie P. ([id number]) -------------------------------------------------------------------------------- Vitals Details Patient Name: Janann AugustBLACK, Pearle P. Date of Service: 11/27/2017 10:00 AM Medical Record Number: [id number] Patient Account Number: [id number] Date of Birth/Sex: 07/21/1920 (84 y.o. Female) Treating RN: Curtis Sitesorthy, Joanna Primary Care Curtistine Pettitt: TATE, Katherina RightENNY Other Clinician: Referring Mallissa Lorenzen: Dewaine OatsATE, DENNY Treating Ross Hefferan/Extender: Kathreen Cosieroulter, Leah Weeks in Treatment: 2 Vital Signs Time Taken: 09:53 Temperature (F): 97.8 Pulse (bpm): 66 Respiratory Rate (breaths/min): 16 Blood Pressure (mmHg): 139/60 Reference Range: 80 - 120 mg / dl Electronic Signature(s) Signed: 11/27/2017 5:33:45 PM By: Curtis Sitesorthy, Joanna Entered By: Curtis Sitesorthy, Joanna on 11/27/2017 09:54:30

## 2017-12-18 ENCOUNTER — Emergency Department: Payer: Medicare Other

## 2017-12-18 ENCOUNTER — Emergency Department
Admission: EM | Admit: 2017-12-18 | Discharge: 2017-12-18 | Disposition: A | Discharge status: Skilled Nursing Facility | Payer: Medicare Other | Source: Home / Self Care | Attending: Emergency Medicine | Admitting: Emergency Medicine

## 2017-12-18 DIAGNOSIS — Z515 Encounter for palliative care: Secondary | ICD-10-CM | POA: Diagnosis not present

## 2017-12-18 DIAGNOSIS — G9341 Metabolic encephalopathy: Secondary | ICD-10-CM | POA: Diagnosis not present

## 2017-12-18 DIAGNOSIS — I10 Essential (primary) hypertension: Secondary | ICD-10-CM

## 2017-12-18 DIAGNOSIS — W19XXXA Unspecified fall, initial encounter: Secondary | ICD-10-CM | POA: Diagnosis not present

## 2017-12-18 DIAGNOSIS — Z79899 Other long term (current) drug therapy: Secondary | ICD-10-CM | POA: Insufficient documentation

## 2017-12-18 DIAGNOSIS — H3530 Unspecified macular degeneration: Secondary | ICD-10-CM | POA: Diagnosis not present

## 2017-12-18 DIAGNOSIS — S0083XA Contusion of other part of head, initial encounter: Secondary | ICD-10-CM | POA: Diagnosis not present

## 2017-12-18 DIAGNOSIS — N390 Urinary tract infection, site not specified: Secondary | ICD-10-CM | POA: Diagnosis not present

## 2017-12-18 DIAGNOSIS — N3000 Acute cystitis without hematuria: Secondary | ICD-10-CM | POA: Insufficient documentation

## 2017-12-18 DIAGNOSIS — F419 Anxiety disorder, unspecified: Secondary | ICD-10-CM | POA: Diagnosis not present

## 2017-12-18 DIAGNOSIS — I2510 Atherosclerotic heart disease of native coronary artery without angina pectoris: Secondary | ICD-10-CM

## 2017-12-18 DIAGNOSIS — Z66 Do not resuscitate: Secondary | ICD-10-CM | POA: Diagnosis not present

## 2017-12-18 DIAGNOSIS — H409 Unspecified glaucoma: Secondary | ICD-10-CM | POA: Diagnosis not present

## 2017-12-18 DIAGNOSIS — Z7982 Long term (current) use of aspirin: Secondary | ICD-10-CM

## 2017-12-18 DIAGNOSIS — G40909 Epilepsy, unspecified, not intractable, without status epilepticus: Secondary | ICD-10-CM | POA: Diagnosis not present

## 2017-12-18 DIAGNOSIS — I129 Hypertensive chronic kidney disease with stage 1 through stage 4 chronic kidney disease, or unspecified chronic kidney disease: Secondary | ICD-10-CM | POA: Diagnosis not present

## 2017-12-18 DIAGNOSIS — N189 Chronic kidney disease, unspecified: Secondary | ICD-10-CM | POA: Diagnosis not present

## 2017-12-18 DIAGNOSIS — S0093XA Contusion of unspecified part of head, initial encounter: Secondary | ICD-10-CM | POA: Diagnosis not present

## 2017-12-18 DIAGNOSIS — Y92099 Unspecified place in other non-institutional residence as the place of occurrence of the external cause: Secondary | ICD-10-CM | POA: Diagnosis not present

## 2017-12-18 DIAGNOSIS — R4182 Altered mental status, unspecified: Secondary | ICD-10-CM

## 2017-12-18 DIAGNOSIS — S065X9A Traumatic subdural hemorrhage with loss of consciousness of unspecified duration, initial encounter: Secondary | ICD-10-CM | POA: Diagnosis not present

## 2017-12-18 DIAGNOSIS — R451 Restlessness and agitation: Secondary | ICD-10-CM | POA: Diagnosis not present

## 2017-12-18 DIAGNOSIS — F0280 Dementia in other diseases classified elsewhere without behavioral disturbance: Secondary | ICD-10-CM | POA: Diagnosis not present

## 2017-12-18 LAB — URINALYSIS, COMPLETE (UACMP) WITH MICROSCOPIC
Bilirubin Urine: NEGATIVE
Glucose, UA: NEGATIVE mg/dL
Hgb urine dipstick: NEGATIVE
Ketones, ur: NEGATIVE mg/dL
NITRITE: POSITIVE — AB
Protein, ur: 30 mg/dL — AB
SPECIFIC GRAVITY, URINE: 1.015 (ref 1.005–1.030)
pH: 5 (ref 5.0–8.0)

## 2017-12-18 LAB — COMPREHENSIVE METABOLIC PANEL
ALBUMIN: 3.6 g/dL (ref 3.5–5.0)
ALK PHOS: 75 U/L (ref 38–126)
ALT: 16 U/L (ref 14–54)
AST: 34 U/L (ref 15–41)
Anion gap: 8 (ref 5–15)
BUN: 31 mg/dL — ABNORMAL HIGH (ref 6–20)
CALCIUM: 9.1 mg/dL (ref 8.9–10.3)
CO2: 24 mmol/L (ref 22–32)
Chloride: 110 mmol/L (ref 101–111)
Creatinine, Ser: 1.81 mg/dL — ABNORMAL HIGH (ref 0.44–1.00)
GFR calc Af Amer: 26 mL/min — ABNORMAL LOW (ref >60)
GFR calc non Af Amer: 23 mL/min — ABNORMAL LOW (ref >60)
GLUCOSE: 100 mg/dL — AB (ref 65–99)
Potassium: 5 mmol/L (ref 3.5–5.1)
SODIUM: 142 mmol/L (ref 135–145)
Total Bilirubin: 0.9 mg/dL (ref 0.3–1.2)
Total Protein: 7.5 g/dL (ref 6.5–8.1)

## 2017-12-18 LAB — CBC WITH DIFFERENTIAL/PLATELET
Basophils Absolute: 0 10*3/uL (ref 0–0.1)
Basophils Relative: 1 %
EOS ABS: 0.4 10*3/uL (ref 0–0.7)
EOS PCT: 7 %
HCT: 37.5 % (ref 35.0–47.0)
HEMOGLOBIN: 12.3 g/dL (ref 12.0–16.0)
LYMPHS ABS: 0.9 10*3/uL — AB (ref 1.0–3.6)
Lymphocytes Relative: 16 %
MCH: 28.3 pg (ref 26.0–34.0)
MCHC: 32.9 g/dL (ref 32.0–36.0)
MCV: 85.8 fL (ref 80.0–100.0)
MONOS PCT: 22 %
Monocytes Absolute: 1.2 10*3/uL — ABNORMAL HIGH (ref 0.2–0.9)
Neutro Abs: 3.2 10*3/uL (ref 1.4–6.5)
Neutrophils Relative %: 54 %
PLATELETS: 158 10*3/uL (ref 150–440)
RBC: 4.37 MIL/uL (ref 3.80–5.20)
RDW: 14.4 % (ref 11.5–14.5)
WBC: 5.8 10*3/uL (ref 3.6–11.0)

## 2017-12-18 LAB — TROPONIN I: Troponin I: <0.03 ng/mL (ref <0.03)

## 2017-12-18 LAB — INFLUENZA PANEL BY PCR (TYPE A & B)
INFLAPCR: NEGATIVE
Influenza B By PCR: NEGATIVE

## 2017-12-18 MED ORDER — SODIUM CHLORIDE 0.9 % IV BOLUS (SEPSIS)
1000.0000 mL | Freq: Once | INTRAVENOUS | Status: AC
  Administered 2017-12-18: 1000 mL via INTRAVENOUS

## 2017-12-18 MED ORDER — SODIUM CHLORIDE 0.9 % IV SOLN
1.0000 g | Freq: Once | INTRAVENOUS | Status: AC
  Administered 2017-12-18: 1 g via INTRAVENOUS
  Filled 2017-12-18: qty 10

## 2017-12-18 MED ORDER — CEPHALEXIN 250 MG PO CAPS: 250.0000 mg | ORAL_CAPSULE | Freq: Three times a day (TID) | ORAL | 0 refills | Status: DC

## 2017-12-18 NOTE — Discharge Instructions (Signed)
Please take all of your antibiotics as prescribed and follow-up with your primary care physician within 2 days for a recheck.  Return to the emergency department sooner for any concerns.  It was a pleasure to take care of you today, and thank you for coming to our emergency department.  If you have any questions or concerns before leaving please ask the nurse to grab me and I'm more than happy to go through your aftercare instructions again.  If you were prescribed any opioid pain medication today such as Norco, Vicodin, Percocet, morphine, hydrocodone, or oxycodone please make sure you do not drive when you are taking this medication as it can alter your ability to drive safely.  If you have any concerns once you are home that you are not improving or are in fact getting worse before you can make it to your follow-up appointment, please do not hesitate to call 911 and come back for further evaluation.  Merrily Brittle, MD  Results for orders placed or performed during the hospital encounter of 12/18/17  Urinalysis, Complete w Microscopic  Result Value Ref Range   Color, Urine YELLOW (A) YELLOW   APPearance CLOUDY (A) CLEAR   Specific Gravity, Urine 1.015 1.005 - 1.030   pH 5.0 5.0 - 8.0   Glucose, UA NEGATIVE NEGATIVE mg/dL   Hgb urine dipstick NEGATIVE NEGATIVE   Bilirubin Urine NEGATIVE NEGATIVE   Ketones, ur NEGATIVE NEGATIVE mg/dL   Protein, ur 30 (A) NEGATIVE mg/dL   Nitrite POSITIVE (A) NEGATIVE   Leukocytes, UA MODERATE (A) NEGATIVE   RBC / HPF 0-5 0 - 5 RBC/hpf   WBC, UA TOO NUMEROUS TO COUNT 0 - 5 WBC/hpf   Bacteria, UA MANY (A) NONE SEEN   Squamous Epithelial / LPF 0-5 (A) NONE SEEN   WBC Clumps PRESENT   Comprehensive metabolic panel  Result Value Ref Range   Sodium 142 135 - 145 mmol/L   Potassium 5.0 3.5 - 5.1 mmol/L   Chloride 110 101 - 111 mmol/L   CO2 24 22 - 32 mmol/L   Glucose, Bld 100 (H) 65 - 99 mg/dL   BUN 31 (H) 6 - 20 mg/dL   Creatinine, Ser 1.61 (H) 0.44 -  1.00 mg/dL   Calcium 9.1 8.9 - 09.6 mg/dL   Total Protein 7.5 6.5 - 8.1 g/dL   Albumin 3.6 3.5 - 5.0 g/dL   AST 34 15 - 41 U/L   ALT 16 14 - 54 U/L   Alkaline Phosphatase 75 38 - 126 U/L   Total Bilirubin 0.9 0.3 - 1.2 mg/dL   GFR calc non Af Amer 23 (L) >60 mL/min   GFR calc Af Amer 26 (L) >60 mL/min   Anion gap 8 5 - 15  Troponin I  Result Value Ref Range   Troponin I <0.03 <0.03 ng/mL  CBC with Differential  Result Value Ref Range   WBC 5.8 3.6 - 11.0 K/uL   RBC 4.37 3.80 - 5.20 MIL/uL   Hemoglobin 12.3 12.0 - 16.0 g/dL   HCT 04.5 40.9 - 81.1 %   MCV 85.8 80.0 - 100.0 fL   MCH 28.3 26.0 - 34.0 pg   MCHC 32.9 32.0 - 36.0 g/dL   RDW 91.4 78.2 - 95.6 %   Platelets 158 150 - 440 K/uL   Neutrophils Relative % 54 %   Neutro Abs 3.2 1.4 - 6.5 K/uL   Lymphocytes Relative 16 %   Lymphs Abs 0.9 (L) 1.0 - 3.6  K/uL   Monocytes Relative 22 %   Monocytes Absolute 1.2 (H) 0.2 - 0.9 K/uL   Eosinophils Relative 7 %   Eosinophils Absolute 0.4 0 - 0.7 K/uL   Basophils Relative 1 %   Basophils Absolute 0.0 0 - 0.1 K/uL   Ct Head Wo Contrast  Result Date: 12/18/2017 CLINICAL DATA:  Altered level of consciousness, confusion EXAM: CT HEAD WITHOUT CONTRAST TECHNIQUE: Contiguous axial images were obtained from the base of the skull through the vertex without intravenous contrast. COMPARISON:  CT brain scan of 02/11/2017 FINDINGS: Brain: The ventricular system remains prominent as do the cortical sulci, consistent with diffuse atrophy. Cavum septum pellucidum, a normal variation, is stable. Moderately severe small vessel ischemic change is again noted throughout the periventricular white matter which may have progressed somewhat in the interval. However, no acute hemorrhage, mass lesion, or infarction is seen. Vascular: No vascular abnormality is noted on this unenhanced study. Skull: On bone window images, no calvarial abnormality is seen. Sinuses/Orbits: The paranasal sinuses are well pneumatized.  No air-fluid level is seen. Other: None. IMPRESSION: 1. No acute intracranial abnormality. 2. Little change to slight progression of moderately severe small vessel ischemic change throughout the periventricular white matter. Electronically Signed   By: Dwyane DeePaul  Barry M.D.   On: 12/18/2017 15:20   Dg Chest Port 1 View  Result Date: 12/18/2017 CLINICAL DATA:  Cough and confusion. EXAM: PORTABLE CHEST 1 VIEW COMPARISON:  09/12/2017 FINDINGS: Stable cardiomegaly with moderate aortic atherosclerosis. Chronic mild diffuse interstitial prominence is Re demonstrated likely representing stigmata of chronic interstitial lung disease. Small right upper lobe opacity is noted that may represent a summation of overlapping ribs and scapula. A small focus of pneumonia is not entirely excluded. There is no effusion or overt pulmonary edema. Osteoarthritis of the shoulders. IMPRESSION: 1. Small focus of opacity in the right upper lobe either representing a summation artifact from overlapping ribs and scapula or potentially a small focus of pneumonia. 2. Chronic mild interstitial prominence likely representing chronic interstitial lung disease. 3. Stable cardiomegaly with aortic atherosclerosis. Electronically Signed   By: Tollie Ethavid  Kwon M.D.   On: 12/18/2017 15:22

## 2017-12-18 NOTE — ED Provider Notes (Signed)
[facility] Emergency Department Provider Note  ____________________________________________   First MD Initiated Contact with Patient 12/18/17 1432     (approximate)  I have reviewed the triage vital signs and the nursing notes.   HISTORY  Chief Complaint Altered Mental Status  Level 5 exemption history limited by the patient's dementia  HPI [name] is a 84 y.o. female who sent to the emergency department via EMS after reported change in her mental status from her nursing home.  The patient is a challenging historian and only says "it hurts".  Past Medical History:  Diagnosis Date  . Anxiety   . Coronary artery disease   . Dehydration   . Glaucoma   . Hypertension   . Macular degeneration   . Memory change   . Seizures [facility](HCC)     Patient Active Problem List   Diagnosis Date Noted  . UTI (urinary tract infection) 06/06/2015  . Acute encephalopathy 06/04/2015  . Sepsis due to pneumonia (HCC) 06/04/2015  . Diarrhea 06/04/2015  . HTN (hypertension) 06/04/2015    No past surgical history on file.  Prior to Admission medications   Medication Sig Start Date End Date Taking? Authorizing Provider  acetaminophen (TYLENOL) 500 MG tablet Take 500 mg by mouth every 6 (six) hours as needed for mild pain, moderate pain, fever or headache.   Yes [provider]  aspirin 81 MG chewable tablet Chew 81 mg by mouth daily.   Yes [provider]  bimatoprost (LUMIGAN) 0.01 % SOLN Place 1 drop into the right eye at bedtime.   Yes [provider]  brimonidine-timolol (COMBIGAN) 0.2-0.5 % ophthalmic solution Place 1 drop into the right eye 2 (two) times daily.    Yes [provider]  busPIRone (BUSPAR) 10 MG tablet Take 10 mg by mouth 2 (two) times daily.    Yes [provider]  Dimethicone (BAZA CLEANSE & PROTECT EX) Apply 1 application topically 3 (three) times daily as needed.   Yes [provider]    divalproex (DEPAKOTE SPRINKLE) 125 MG capsule Take 250 mg by mouth 2 (two) times daily.   Yes [provider]  docusate sodium (COLACE) 100 MG capsule Take 100 mg by mouth 2 (two) times daily as needed for mild constipation or moderate constipation.   Yes [provider]  feeding supplement, ENSURE ENLIVE, (ENSURE ENLIVE) LIQD Take 1 Bottle by mouth 2 (two) times daily between meals. Liquid vanilla with meals-0.046-1.05   Yes [provider]  hydrALAZINE (APRESOLINE) 10 MG tablet Take 10 mg by mouth 3 (three) times daily.    Yes [provider]  hydroxypropyl methylcellulose / hypromellose (ISOPTO TEARS / GONIOVISC) 2.5 % ophthalmic solution Place 1 drop into both eyes 2 (two) times daily as needed for dry eyes.   Yes [provider]  levETIRAcetam (KEPPRA) 500 MG tablet Take 500 mg by mouth 2 (two) times daily.   Yes [provider]  LORazepam (ATIVAN) 0.5 MG tablet Take 0.5 mg by mouth 2 (two) times daily as needed for anxiety.   Yes [provider]  Multiple Vitamins-Minerals (PRESERVISION AREDS) TABS Take 1 tablet by mouth 2 (two) times daily.   Yes [provider]  naproxen sodium (ANAPROX) 220 MG tablet Take 220 mg by mouth 2 (two) times daily with a meal.    Yes [provider]  neomycin-bacitracin-polymyxin (NEOSPORIN) OINT Apply 1 application topically 2 (two) times daily.   Yes [provider]  nystatin  ointment (MYCOSTATIN) Apply 1 application topically 2 (two) times daily as needed (for rash/irritated skin area). Apply to groin and inner thighs   Yes [provider]  Polyethyl Glycol-Propyl Glycol (SYSTANE ULTRA) 0.4-0.3 % SOLN Place 1 drop into both eyes 4 (four) times daily.   Yes [provider]  tolterodine (DETROL) 1 MG tablet Take 1 mg by mouth every other day.   Yes [provider]  cephALEXin (KEFLEX) 250 MG capsule Take 1 capsule (250 mg total) by mouth 3 (three)  times daily for 10 days. 12/18/17 12/28/17  Merrily Brittle, MD    Allergies Patient has no known allergies.  Family History  Family history unknown: Yes    Social History Social History   Tobacco Use  . Smoking status: Never Smoker  . Smokeless tobacco: Never Used  Substance Use Topics  . Alcohol use: No  . Drug use: No    Review of Systems Level 5 exemption history limited by the patient's dementia  ____________________________________________   PHYSICAL EXAM:  VITAL SIGNS: ED Triage Vitals  Enc Vitals Group     BP      Pulse      Resp      Temp      Temp src      SpO2      Weight      Height      Head Circumference      Peak Flow      Pain Score      Pain Loc      Pain Edu?      Excl. in GC?     Constitutional: Pleasant clearly confused although in no acute distress severe dementia Eyes: PERRL EOMI. Head: Atraumatic. Nose: No congestion/rhinnorhea. Mouth/Throat: No trismus Neck: No stridor.   Cardiovascular: Normal rate, regular rhythm. Grossly normal heart sounds.  Good peripheral circulation. Respiratory: Normal respiratory effort.  No retractions. Lungs CTAB and moving good air Gastrointestinal: Soft nontender Musculoskeletal: No lower extremity edema   Neurologic:  . No gross focal neurologic deficits are appreciated. Skin:  Skin is warm, dry and intact. No rash noted. Psychiatric: Severe dementia    ____________________________________________   DIFFERENTIAL includes but not limited to  Encephalopathy, sepsis, urinary tract infection, intracerebral hemorrhage, stroke, influenza ____________________________________________   LABS (all labs ordered are listed, but only abnormal results are displayed)  Labs Reviewed  URINALYSIS, COMPLETE (UACMP) WITH MICROSCOPIC - Abnormal; Notable for the following components:      Result Value   Color, Urine YELLOW (*)    APPearance CLOUDY (*)    Protein, ur 30 (*)    Nitrite POSITIVE (*)     Leukocytes, UA MODERATE (*)    Bacteria, UA MANY (*)    Squamous Epithelial / LPF 0-5 (*)    All other components within normal limits  COMPREHENSIVE METABOLIC PANEL - Abnormal; Notable for the following components:   Glucose, Bld 100 (*)    BUN 31 (*)    Creatinine, Ser 1.81 (*)    GFR calc non Af Amer 23 (*)    GFR calc Af Amer 26 (*)    All other components within normal limits  CBC WITH DIFFERENTIAL/PLATELET - Abnormal; Notable for the following components:   Lymphs Abs 0.9 (*)    Monocytes Absolute 1.2 (*)    All other components within normal limits  URINE CULTURE  TROPONIN I  INFLUENZA PANEL BY PCR (TYPE A & B)    Lab work reviewed by me  with chronic kidney disease at baseline urinalysis consistent with infection __________________________________________  EKG   ____________________________________________  RADIOLOGY  Chest x-ray reviewed by me with no acute disease Head CT reviewed by me with no acute disease ____________________________________________   PROCEDURES  Procedure(s) performed: no  Procedures  Critical Care performed: no  Observation: no ____________________________________________   INITIAL IMPRESSION / ASSESSMENT AND PLAN / ED COURSE  Pertinent labs & imaging results that were available during my care of the patient were reviewed by me and considered in my medical decision making (see chart for details).  Differential in a confused 84 year old woman is extremely broad but includes metabolic derangement, urinary tract infection, and intracerebral pathology.  Labs flu head CT and in and out urinalysis are pending.     ----------------------------------------- 3:29 PM on 12/18/2017 -----------------------------------------  The patient's urinalysis is consistent with infection.  On review of her last urine culture that was positive in 2018 it was pansensitive E. coli.  Culture sent today but 1 g of ceftriaxone  now. ____________________________________________  ----------------------------------------- 3:46 PM on 12/18/2017 -----------------------------------------  The patient's caretakers at bedside.  We discussed the diagnosis and the importance of treatment as an outpatient with oral antibiotics following this dose of ceftriaxone.  The patient will be medically stable for outpatient management.  FINAL CLINICAL IMPRESSION(S) / ED DIAGNOSES  Final diagnoses:  Altered mental status, unspecified altered mental status type  Acute cystitis without hematuria      NEW MEDICATIONS STARTED DURING THIS VISIT:  New Prescriptions   CEPHALEXIN (KEFLEX) 250 MG CAPSULE    Take 1 capsule (250 mg total) by mouth 3 (three) times daily for 10 days.     Note:  This document was prepared using Dragon voice recognition software and may include unintentional dictation errors.     Merrily Brittle, MD 12/18/17 1547

## 2017-12-18 NOTE — ED Triage Notes (Addendum)
[facility] [place]. Facility report pt is more confused than normal. Pt baseline is Alert cant follow simple command and ambulates with assistive device.

## 2017-12-20 ENCOUNTER — Other Ambulatory Visit: Payer: Self-pay

## 2017-12-20 ENCOUNTER — Inpatient Hospital Stay
Admission: EM | Admit: 2017-12-20 | Discharge: 2017-12-21 | DRG: 082 | Disposition: A | Discharge status: Hospice | Payer: Medicare Other | Attending: Internal Medicine | Admitting: Internal Medicine

## 2017-12-20 ENCOUNTER — Emergency Department: Payer: Medicare Other

## 2017-12-20 ENCOUNTER — Encounter: Payer: Self-pay | Admitting: Emergency Medicine

## 2017-12-20 DIAGNOSIS — Z79899 Other long term (current) drug therapy: Secondary | ICD-10-CM

## 2017-12-20 DIAGNOSIS — F0280 Dementia in other diseases classified elsewhere without behavioral disturbance: Secondary | ICD-10-CM | POA: Diagnosis present

## 2017-12-20 DIAGNOSIS — Y92099 Unspecified place in other non-institutional residence as the place of occurrence of the external cause: Secondary | ICD-10-CM

## 2017-12-20 DIAGNOSIS — I2510 Atherosclerotic heart disease of native coronary artery without angina pectoris: Secondary | ICD-10-CM | POA: Diagnosis present

## 2017-12-20 DIAGNOSIS — S065XAA Traumatic subdural hemorrhage with loss of consciousness status unknown, initial encounter: Secondary | ICD-10-CM

## 2017-12-20 DIAGNOSIS — N189 Chronic kidney disease, unspecified: Secondary | ICD-10-CM | POA: Diagnosis present

## 2017-12-20 DIAGNOSIS — W19XXXA Unspecified fall, initial encounter: Secondary | ICD-10-CM | POA: Diagnosis present

## 2017-12-20 DIAGNOSIS — I129 Hypertensive chronic kidney disease with stage 1 through stage 4 chronic kidney disease, or unspecified chronic kidney disease: Secondary | ICD-10-CM | POA: Diagnosis present

## 2017-12-20 DIAGNOSIS — F419 Anxiety disorder, unspecified: Secondary | ICD-10-CM | POA: Diagnosis present

## 2017-12-20 DIAGNOSIS — Z515 Encounter for palliative care: Secondary | ICD-10-CM | POA: Diagnosis present

## 2017-12-20 DIAGNOSIS — G9340 Encephalopathy, unspecified: Secondary | ICD-10-CM | POA: Diagnosis present

## 2017-12-20 DIAGNOSIS — Z66 Do not resuscitate: Secondary | ICD-10-CM | POA: Diagnosis not present

## 2017-12-20 DIAGNOSIS — I10 Essential (primary) hypertension: Secondary | ICD-10-CM | POA: Diagnosis present

## 2017-12-20 DIAGNOSIS — S0093XA Contusion of unspecified part of head, initial encounter: Secondary | ICD-10-CM | POA: Diagnosis not present

## 2017-12-20 DIAGNOSIS — S065X9A Traumatic subdural hemorrhage with loss of consciousness of unspecified duration, initial encounter: Principal | ICD-10-CM | POA: Diagnosis present

## 2017-12-20 DIAGNOSIS — G40909 Epilepsy, unspecified, not intractable, without status epilepticus: Secondary | ICD-10-CM | POA: Diagnosis present

## 2017-12-20 DIAGNOSIS — H409 Unspecified glaucoma: Secondary | ICD-10-CM | POA: Diagnosis not present

## 2017-12-20 DIAGNOSIS — S0083XA Contusion of other part of head, initial encounter: Secondary | ICD-10-CM | POA: Diagnosis present

## 2017-12-20 DIAGNOSIS — G9341 Metabolic encephalopathy: Secondary | ICD-10-CM | POA: Diagnosis not present

## 2017-12-20 DIAGNOSIS — N390 Urinary tract infection, site not specified: Secondary | ICD-10-CM | POA: Diagnosis not present

## 2017-12-20 DIAGNOSIS — R451 Restlessness and agitation: Secondary | ICD-10-CM | POA: Diagnosis present

## 2017-12-20 DIAGNOSIS — Z7982 Long term (current) use of aspirin: Secondary | ICD-10-CM

## 2017-12-20 DIAGNOSIS — R296 Repeated falls: Secondary | ICD-10-CM

## 2017-12-20 DIAGNOSIS — H3530 Unspecified macular degeneration: Secondary | ICD-10-CM | POA: Diagnosis present

## 2017-12-20 DIAGNOSIS — Z791 Long term (current) use of non-steroidal anti-inflammatories (NSAID): Secondary | ICD-10-CM

## 2017-12-20 LAB — BASIC METABOLIC PANEL
Anion gap: 13 (ref 5–15)
BUN: 33 mg/dL — AB (ref 6–20)
CHLORIDE: 116 mmol/L — AB (ref 101–111)
CO2: 18 mmol/L — AB (ref 22–32)
Calcium: 9.1 mg/dL (ref 8.9–10.3)
Creatinine, Ser: 1.54 mg/dL — ABNORMAL HIGH (ref 0.44–1.00)
GFR calc Af Amer: 32 mL/min — ABNORMAL LOW (ref >60)
GFR calc non Af Amer: 28 mL/min — ABNORMAL LOW (ref >60)
GLUCOSE: 105 mg/dL — AB (ref 65–99)
POTASSIUM: 4.8 mmol/L (ref 3.5–5.1)
Sodium: 147 mmol/L — ABNORMAL HIGH (ref 135–145)

## 2017-12-20 LAB — URINALYSIS, COMPLETE (UACMP) WITH MICROSCOPIC
Bacteria, UA: NONE SEEN
Bilirubin Urine: NEGATIVE
Glucose, UA: NEGATIVE mg/dL
Hgb urine dipstick: NEGATIVE
KETONES UR: NEGATIVE mg/dL
Nitrite: NEGATIVE
PROTEIN: NEGATIVE mg/dL
SQUAMOUS EPITHELIAL / LPF: NONE SEEN
Specific Gravity, Urine: 1.016 (ref 1.005–1.030)
pH: 5 (ref 5.0–8.0)

## 2017-12-20 LAB — CK: CK TOTAL: 58 U/L (ref 38–234)

## 2017-12-20 LAB — CBC WITH DIFFERENTIAL/PLATELET
Basophils Absolute: 0 10*3/uL (ref 0–0.1)
Basophils Relative: 1 %
EOS PCT: 6 %
Eosinophils Absolute: 0.4 10*3/uL (ref 0–0.7)
HEMATOCRIT: 39.4 % (ref 35.0–47.0)
HEMOGLOBIN: 12.6 g/dL (ref 12.0–16.0)
LYMPHS ABS: 1.2 10*3/uL (ref 1.0–3.6)
LYMPHS PCT: 20 %
MCH: 28 pg (ref 26.0–34.0)
MCHC: 32 g/dL (ref 32.0–36.0)
MCV: 87.3 fL (ref 80.0–100.0)
Monocytes Absolute: 1 10*3/uL — ABNORMAL HIGH (ref 0.2–0.9)
Monocytes Relative: 17 %
NEUTROS ABS: 3.4 10*3/uL (ref 1.4–6.5)
Neutrophils Relative %: 56 %
Platelets: 156 10*3/uL (ref 150–440)
RBC: 4.52 MIL/uL (ref 3.80–5.20)
RDW: 15.2 % — ABNORMAL HIGH (ref 11.5–14.5)
WBC: 6 10*3/uL (ref 3.6–11.0)

## 2017-12-20 LAB — URINE CULTURE

## 2017-12-20 LAB — TROPONIN I: Troponin I: <0.03 ng/mL (ref <0.03)

## 2017-12-20 MED ORDER — OXYBUTYNIN CHLORIDE ER 10 MG PO TB24
10.0000 mg | ORAL_TABLET | Freq: Every day | ORAL | Status: DC
  Filled 2017-12-20: qty 1

## 2017-12-20 MED ORDER — ENSURE ENLIVE PO LIQD: 1.0000 | Freq: Two times a day (BID) | ORAL | Status: DC

## 2017-12-20 MED ORDER — HALOPERIDOL LACTATE 5 MG/ML IJ SOLN
1.0000 mg | Freq: Once | INTRAMUSCULAR | Status: AC
Start: 2017-12-20 — End: 2017-12-20
  Administered 2017-12-20: 1 mg via INTRAVENOUS
  Filled 2017-12-20: qty 1

## 2017-12-20 MED ORDER — PANTOPRAZOLE SODIUM 40 MG IV SOLR
40.0000 mg | Freq: Two times a day (BID) | INTRAVENOUS | Status: DC
  Administered 2017-12-20 (×2): 40 mg via INTRAVENOUS
  Filled 2017-12-20 (×2): qty 40

## 2017-12-20 MED ORDER — ONDANSETRON HCL 4 MG/2ML IJ SOLN: 4.0000 mg | Freq: Four times a day (QID) | INTRAMUSCULAR | Status: DC | PRN

## 2017-12-20 MED ORDER — HYDRALAZINE HCL 10 MG PO TABS
10.0000 mg | ORAL_TABLET | Freq: Three times a day (TID) | ORAL | Status: DC
  Filled 2017-12-20 (×3): qty 1

## 2017-12-20 MED ORDER — HYDRALAZINE HCL 20 MG/ML IJ SOLN
10.0000 mg | INTRAMUSCULAR | Status: DC | PRN
Start: 2017-12-20 — End: 2017-12-21
  Administered 2017-12-20: 10 mg via INTRAVENOUS
  Filled 2017-12-20: qty 1

## 2017-12-20 MED ORDER — ONDANSETRON HCL 4 MG PO TABS: 4.0000 mg | ORAL_TABLET | Freq: Four times a day (QID) | ORAL | Status: DC | PRN

## 2017-12-20 MED ORDER — ASPIRIN 81 MG PO CHEW: 81.0000 mg | CHEWABLE_TABLET | Freq: Every day | ORAL | Status: DC

## 2017-12-20 MED ORDER — TIMOLOL MALEATE 0.5 % OP SOLN
1.0000 [drp] | Freq: Two times a day (BID) | OPHTHALMIC | Status: DC
  Administered 2017-12-20: 1 [drp] via OPHTHALMIC
  Filled 2017-12-20: qty 5

## 2017-12-20 MED ORDER — ACETAMINOPHEN 650 MG RE SUPP: 650.0000 mg | Freq: Four times a day (QID) | RECTAL | Status: DC | PRN

## 2017-12-20 MED ORDER — LEVETIRACETAM 500 MG PO TABS
500.0000 mg | ORAL_TABLET | Freq: Two times a day (BID) | ORAL | Status: DC
  Filled 2017-12-20 (×2): qty 1

## 2017-12-20 MED ORDER — DOCUSATE SODIUM 100 MG PO CAPS: 100.0000 mg | ORAL_CAPSULE | Freq: Two times a day (BID) | ORAL | Status: DC

## 2017-12-20 MED ORDER — LATANOPROST 0.005 % OP SOLN
1.0000 [drp] | Freq: Every day | OPHTHALMIC | Status: DC
  Administered 2017-12-20: 1 [drp] via OPHTHALMIC
  Filled 2017-12-20: qty 2.5

## 2017-12-20 MED ORDER — DIVALPROEX SODIUM 125 MG PO CSDR
250.0000 mg | DELAYED_RELEASE_CAPSULE | Freq: Two times a day (BID) | ORAL | Status: DC
  Filled 2017-12-20 (×2): qty 2

## 2017-12-20 MED ORDER — LORAZEPAM 2 MG/ML IJ SOLN
0.5000 mg | INTRAMUSCULAR | Status: DC | PRN
  Administered 2017-12-20 – 2017-12-21 (×3): 0.5 mg via INTRAVENOUS
  Filled 2017-12-20 (×3): qty 1

## 2017-12-20 MED ORDER — ACETAMINOPHEN 325 MG PO TABS: 650.0000 mg | ORAL_TABLET | Freq: Four times a day (QID) | ORAL | Status: DC | PRN

## 2017-12-20 MED ORDER — LORAZEPAM 2 MG/ML IJ SOLN
0.5000 mg | Freq: Once | INTRAMUSCULAR | Status: AC
Start: 2017-12-20 — End: 2017-12-20
  Administered 2017-12-20: 0.5 mg via INTRAVENOUS
  Filled 2017-12-20: qty 1

## 2017-12-20 MED ORDER — BRIMONIDINE TARTRATE-TIMOLOL 0.2-0.5 % OP SOLN
1.0000 [drp] | Freq: Two times a day (BID) | OPHTHALMIC | Status: DC
  Filled 2017-12-20: qty 5

## 2017-12-20 MED ORDER — HYPROMELLOSE (GONIOSCOPIC) 2.5 % OP SOLN
1.0000 [drp] | Freq: Two times a day (BID) | OPHTHALMIC | Status: DC | PRN
  Filled 2017-12-20: qty 15

## 2017-12-20 MED ORDER — SODIUM CHLORIDE 0.45 % IV SOLN
INTRAVENOUS | Status: DC
  Administered 2017-12-20 – 2017-12-21 (×2): via INTRAVENOUS

## 2017-12-20 MED ORDER — BRIMONIDINE TARTRATE 0.2 % OP SOLN
1.0000 [drp] | Freq: Two times a day (BID) | OPHTHALMIC | Status: DC
  Administered 2017-12-20: 1 [drp] via OPHTHALMIC
  Filled 2017-12-20: qty 5

## 2017-12-20 MED ORDER — BUSPIRONE HCL 10 MG PO TABS
10.0000 mg | ORAL_TABLET | Freq: Two times a day (BID) | ORAL | Status: DC
  Filled 2017-12-20 (×2): qty 1

## 2017-12-20 MED ORDER — CEFTRIAXONE SODIUM 1 G IJ SOLR
1.0000 g | INTRAMUSCULAR | Status: DC
  Administered 2017-12-20: 1 g via INTRAVENOUS
  Filled 2017-12-20 (×2): qty 10

## 2017-12-20 MED ORDER — BISACODYL 10 MG RE SUPP
10.0000 mg | Freq: Every day | RECTAL | Status: DC | PRN
  Filled 2017-12-20: qty 1

## 2017-12-20 NOTE — ED Provider Notes (Signed)
[facility] Emergency Department Provider Note ____________________________________________   I have reviewed the triage vital signs and the triage nursing note.  HISTORY  Chief Complaint Fall   Historian Level 5 Caveat History Limited by dementia History given by EMS per report from nursing home  HPI [name] is a 84 y.o. female with a history of dementia, not very verbal at baseline, reportedly 911 called from patient being found on the ground outside the nursing home facility.  Apparently she had not been there more than a few minutes.  She was found with a hematoma to her right cheek.  She is not speaking which sounds like at her baseline, but she does have her eyes open and follow some commands although nonverbal.  She was up to her feet and walking, no apparent extremity injuries.  Patient seen for altered mental status 2 days ago and diagnosed with nitrite positive urinary tract infection and started with dose of Rocephin followed by Keflex which she is currently on for urinary tract infection.     Past Medical History:  Diagnosis Date  . Anxiety   . Coronary artery disease   . Dehydration   . Glaucoma   . Hypertension   . Macular degeneration   . Memory change   . Seizures [facility])     Patient Active Problem List   Diagnosis Date Noted  . UTI (urinary tract infection) 06/06/2015  . Acute encephalopathy 06/04/2015  . Sepsis due to pneumonia (HCC) 06/04/2015  . Diarrhea 06/04/2015  . HTN (hypertension) 06/04/2015    History reviewed. No pertinent surgical history.  Prior to Admission medications   Medication Sig Start Date End Date Taking? Authorizing Provider  acetaminophen (TYLENOL) 500 MG tablet Take 500 mg by mouth every 6 (six) hours as needed for mild pain, moderate pain, fever or headache.    [provider]  aspirin 81 MG chewable tablet Chew 81 mg by mouth daily.    [provider]  bimatoprost (LUMIGAN)  0.01 % SOLN Place 1 drop into the right eye at bedtime.    [provider]  brimonidine-timolol (COMBIGAN) 0.2-0.5 % ophthalmic solution Place 1 drop into the right eye 2 (two) times daily.     [provider]  busPIRone (BUSPAR) 10 MG tablet Take 10 mg by mouth 2 (two) times daily.     [provider]  cephALEXin (KEFLEX) 250 MG capsule Take 1 capsule (250 mg total) by mouth 3 (three) times daily for 10 days. 12/18/17 12/28/17  Merrily Brittle, MD  Dimethicone (BAZA CLEANSE & PROTECT EX) Apply 1 application topically 3 (three) times daily as needed.    [provider]  divalproex (DEPAKOTE SPRINKLE) 125 MG capsule Take 250 mg by mouth 2 (two) times daily.    [provider]  docusate sodium (COLACE) 100 MG capsule Take 100 mg by mouth 2 (two) times daily as needed for mild constipation or moderate constipation.    [provider]  feeding supplement, ENSURE ENLIVE, (ENSURE ENLIVE) LIQD Take 1 Bottle by mouth 2 (two) times daily between meals. Liquid vanilla with meals-0.046-1.05    [provider]  hydrALAZINE (APRESOLINE) 10 MG tablet Take 10 mg by mouth 3 (three) times daily.     [provider]  hydroxypropyl methylcellulose / hypromellose (ISOPTO TEARS / GONIOVISC) 2.5 % ophthalmic solution Place 1 drop into both eyes 2 (two) times daily as needed for dry eyes.    [provider]  levETIRAcetam (KEPPRA)  500 MG tablet Take 500 mg by mouth 2 (two) times daily.    [provider]  LORazepam (ATIVAN) 0.5 MG tablet Take 0.5 mg by mouth 2 (two) times daily as needed for anxiety.    [provider]  Multiple Vitamins-Minerals (PRESERVISION AREDS) TABS Take 1 tablet by mouth 2 (two) times daily.    [provider]  naproxen sodium (ANAPROX) 220 MG tablet Take 220 mg by mouth 2 (two) times daily with a meal.     [provider]  neomycin-bacitracin-polymyxin (NEOSPORIN) OINT Apply 1  application topically 2 (two) times daily.    [provider]  nystatin ointment (MYCOSTATIN) Apply 1 application topically 2 (two) times daily as needed (for rash/irritated skin area). Apply to groin and inner thighs    [provider]  Polyethyl Glycol-Propyl Glycol (SYSTANE ULTRA) 0.4-0.3 % SOLN Place 1 drop into both eyes 4 (four) times daily.    [provider]  tolterodine (DETROL) 1 MG tablet Take 1 mg by mouth every other day.    [provider]    No Known Allergies  Family History  Family history unknown: Yes    Social History Social History   Tobacco Use  . Smoking status: Never Smoker  . Smokeless tobacco: Never Used  Substance Use Topics  . Alcohol use: No  . Drug use: No    Review of Systems  Level 5 caveat, unable to obtain due to dementia  Patient unable to cooperate in answering any questions regarding any pain.  Again review of chart history indicates she is currently being treated for urinary tract infection.   ____________________________________________   PHYSICAL EXAM:  VITAL SIGNS: ED Triage Vitals  Enc Vitals Group     BP 12/20/17 0834 (!) 151/76     Pulse Rate 12/20/17 0834 (!) 108     Resp 12/20/17 0834 18     Temp 12/20/17 0813 97.9 F (36.6 C)     Temp Source 12/20/17 0813 Rectal     SpO2 12/20/17 0834 97 %     Weight 12/20/17 0825 141 lb (64 kg)     Height --      Head Circumference --      Peak Flow --      Pain Score --      Pain Loc --      Pain Edu? --      Excl. in GC? --      Constitutional: Alert with eyes open but nonverbal.  No acute distress. HEENT   Head: Large hematoma right cheek.      Eyes: Conjunctivae are normal. Pupils equal and round.       Ears:         Nose: No congestion/rhinnorhea.   Mouth/Throat: Mucous membranes are moist.   Neck: No stridor.  No C-spine step-offs.  Placed in a c-collar given unwitnessed fall and dementia. Cardiovascular/Chest: Tachycardic  rate, regular rhythm.  No murmurs, rubs, or gallops. Respiratory: Normal respiratory effort without tachypnea nor retractions. Breath sounds are clear and equal bilaterally. No wheezes/rales/rhonchi. Gastrointestinal: Soft. No distention, no guarding, no rebound. Nontender.    Genitourinary/rectal:Deferred Musculoskeletal: Stable pelvis.  Nontender with normal range of motion in all extremities. No joint effusions.  No lower extremity tenderness.  No edema. Neurologic: No obvious facial droop.  She is not speaking but she is alert and looking around and attending. No gross or focal neurologic deficits are appreciated. Skin:  Skin is warm, dry and intact.  No rash noted.  Ecchymosis right cheek. Psychiatric: No agitation   ____________________________________________  LABS (pertinent positives/negatives) I, Governor Rooksebecca Sanyiah Kanzler, MD the attending physician have reviewed the labs noted below.  Labs Reviewed  CBC WITH DIFFERENTIAL/PLATELET - Abnormal; Notable for the following components:      Result Value   RDW 15.2 (*)    Monocytes Absolute 1.0 (*)    All other components within normal limits  BASIC METABOLIC PANEL - Abnormal; Notable for the following components:   Sodium 147 (*)    Chloride 116 (*)    CO2 18 (*)    Glucose, Bld 105 (*)    BUN 33 (*)    Creatinine, Ser 1.54 (*)    GFR calc non Af Amer 28 (*)    GFR calc Af Amer 32 (*)    All other components within normal limits  URINALYSIS, COMPLETE (UACMP) WITH MICROSCOPIC - Abnormal; Notable for the following components:   Color, Urine YELLOW (*)    APPearance HAZY (*)    Leukocytes, UA SMALL (*)    All other components within normal limits  CK  TROPONIN I    ____________________________________________    EKG I, Governor Rooksebecca Shawntia Mangal, MD, the attending physician have personally viewed and interpreted all ECGs.  109 bpm.  Sinus tachycardia.  Narrow QS.  Left axis deviation.  Nonspecific ST-T wave.  LVH with secondary report  change. ____________________________________________  RADIOLOGY   CT head, maxillofacial and cervical spine noncontrast radiologist report:  IMPRESSION: 1. Small acute RIGHT extra-axial hemorrhage favored subdural hematoma. 2. Small subdural hematoma along the posterior interhemispheric fissure on the RIGHT 3. Mild effacement of the RIGHT temporal lobe. No midline shift. Basal cisterns are patent. 4. Small parenchymal contusion versus subarachnoid hemorrhage over the RIGHT parietal lobe. 5. Extensive atrophy and white matter microvascular disease. 6. No facial bone fracture.  No skull fracture. 7. Large RIGHT cheek soft tissue hematoma 8. No cervical spine fracture  Critical Value/emergent results were called by telephone at the time of interpretation on 12/20/2017 at 11:11 am to Dr. Governor RooksEBECCA Elleigh Cassetta , who verbally acknowledged these results. __________________________________________  PROCEDURES  Procedure(s) performed: None  Procedures  Critical Care performed: None   ____________________________________________  ED COURSE / ASSESSMENT AND PLAN  Pertinent labs & imaging results that were available during my care of the patient were reviewed by me and considered in my medical decision making (see chart for details).   Patient came in after unwitnessed fall with clear head injury.  C-spine without step-off, but she is a poor historian and place a c-collar and will go ahead and evaluate for traumatic injury given the unwitnessed fall and unable to clear clinically.  Does not seem to have any other obvious traumatic injuries in terms of chest, back, abdomen, or extremities.   I spoke with the radiologist by phone confirming several small areas of intracranial bleeding.  Patient is confused but that seems like her baseline.  I spoke with the patient's healthcare power of attorney, Geryl RankinsCarolyn Truluck at number listed in demographics, and she confirmed the patient is DNR and  the patient has a DNR form with her here at the bedside, and she would be comfort care from any standpoint of the head bleed and at this point is in agreement that the patient would not be transferred out to a center with a neurosurgeon at this point they are interested in comfort care.  From the standpoint of the recent urinary tract infection, it looks like the urine  is actually improved from a couple days ago.  There is no elevated white cell count.  She does not appear to be septic.  DIFFERENTIAL DIAGNOSIS: Include but not limited to facial fracture, intracranial traumatic injury, extremity traumatic injury, ongoing urinary tract infection, electric disturbance, dehydration, etc.  CONSULTATIONS:   Hospitalist for admission.   Patient / Family / Caregiver informed of clinical course, medical decision-making process, and agree with plan.     ___________________________________________   FINAL CLINICAL IMPRESSION(S) / ED DIAGNOSES   Final diagnoses:  Subdural hematoma (HCC)  Facial contusion, initial encounter  Unwitnessed fall  Chronic renal failure, unspecified CKD stage      ___________________________________________        Note: This dictation was prepared with Dragon dictation. Any transcriptional errors that result from this process are unintentional    Governor Rooks, MD 12/20/17 1141

## 2017-12-20 NOTE — Plan of Care (Signed)
  Safety: Ability to remain free from injury will improve 12/20/2017 1617 - Progressing by Garwin Brothershomas, Sandford Diop Lynn, RN  Pt placed on low fall bed; room near nurse's station this shift. Education: Knowledge of General Education information will improve 12/20/2017 1617 - Not Progressing by Garwin Brothershomas, Rolan Wrightsman Lynn, RN  Unable to teach, pt on admission, pt nonverbal with history of dementia

## 2017-12-20 NOTE — ED Triage Notes (Signed)
[facility] Per EMS pt found outside on the ground, unknown how long she has been there. Pt has had decline in mental status over the past week. Pt VSS with EMS. Pt has large hematoma to the right cheek. Pt is disoriented at baseline.

## 2017-12-20 NOTE — Care Management (Signed)
Patient presents from [facility]. Has a private caregiver. Advanced dementia. Has subdural hematoma from a fall out side of facility.  There is discussion of comfort care if neuro status declines due to the hematoma.  Patient is DNR

## 2017-12-20 NOTE — ED Notes (Signed)
Pts personal aid is at bedside

## 2017-12-20 NOTE — Progress Notes (Signed)
LCSW completed Fl2 and Passr and assessment. Information will not be faxed out via the [facility] for SNF [facility].   Patient is under continued medical workup and may require comfort care instead.  [facility] Power of [facility] caroline Haynes Dageru Luck [phone]  Bernlaudine [employer] LCSW [phone]

## 2017-12-20 NOTE — Clinical Social Work Note (Signed)
Clinical Social Work Assessment  Patient Details  Name: [name] MRN: [id number] Date of Birth: 1920-09-25  Date of referral:  12/20/17               Reason for consult:  Facility Placement                Permission sought to share information with:  Family Supports, Customer service manager Permission granted to share information::  Yes, Verbal Permission Granted, No  Name::     Vergia Alcon [phone]  Agency::  TBD  Relationship::     Contact Information:     Housing/Transportation Living arrangements for the past 2 months:  Assisted Living Facility(In addition has home instead 8 hours 7days week) Source of Information:  Medical Team Patient Interpreter Needed:  None Criminal Activity/Legal Involvement Pertinent to Current Situation/Hospitalization:  No - Comment as needed Significant Relationships:  Other Family Members Lives with:  Facility Resident Do you feel safe going back to the place where you live?  Yes Need for family participation in patient care:  Yes (Comment)  Care giving concerns:  Niece HCPOA was called and awaiting call back- Patient may reuire higher level of care/based on her prognoses comfort care measures to be applied   Facilities manager / plan: LCSW met with patient and caregiver from home instead. Patient arrived here at ED after a bad fall and head trama. Patient is non verbal and has dementia and alzheimer's. She is currently in [facility] with a home care ( private) 8 hours a day 7 days a week. In consultation  With care provider patient requires full assistance with all her ADLs and has not walked since January of this year. [name] is a 84 y.o. female has a past medical history significant for HTN, CAD, and dementia from ALF after unwitnessed fall with head trauma. Pt has been treated recently for UTI at the facility. No with right facial and head trauma with multiple small subdural hematomas noted on  CT. She is confused and agitated. BP elevated. She is DNR. Spoke with POA(neice) who agrees with DNR status and comfort care if situation worsens. She is now admitted. UTI still present by current labs.( Copied from ED admitting doctor) patient has excellent support from family and facility but may require SNF. HCPOA will determine if patient information should go to SNF. LCSW will complete assessment and fl2 started and Passr but will not send out VIA the HUB until HCPOA agrees.     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) PT Recommendations:  [place] / Referral to community resources: na    Patient/Family's Response to care: TBD  Patient/Family's Understanding of and Emotional Response to Diagnosis, Current Treatment, and Prognosis:  TBD  Emotional Assessment Appearance:  Appears stated age Attitude/Demeanor/Rapport:  Gracious Affect (typically observed):  Appropriate, Restless Orientation:  Fluctuating Orientation (Suspected and/or reported Sundowners) Alcohol / Substance use:  Not Applicable Psych involvement (Current and /or in the community):  No (Comment)  Discharge Needs  Concerns to be addressed:  Decision making concerns Readmission within the last 30 days:  No Current discharge risk:  None Barriers to Discharge:  No Barriers Identified   Joana Reamer, LCSW 12/20/2017, 12:31 PM

## 2017-12-20 NOTE — [place] FL2 (Addendum)
[place] MEDICAID FL2 LEVEL OF CARE SCREENING TOOL     IDENTIFICATION  Patient Name: [name] Birthdate: November 06, 1919 Sex: female Admission Date (Current Location): 12/20/2017  [place] and [place] Number:  Chiropodist and Address:  [facility] [address], [place], [place] [address]      Provider Number: [id number]  Attending Physician Name and Address:  Marguarite Arbour, MD  Relative Name and Phone Number:  Geryl Rankins [phone]    Current Level of Care: Hospital Recommended Level of Care: Skilled Nursing Facility Prior Approval Number:    Date Approved/Denied:   PASRR Number:   [id number]   Discharge Plan: SNF    Current Diagnoses: Patient Active Problem List   Diagnosis Date Noted  . SDH (subdural hematoma) (HCC) 12/20/2017  . UTI (urinary tract infection) 06/06/2015  . Acute encephalopathy 06/04/2015  . Sepsis due to pneumonia (HCC) 06/04/2015  . Diarrhea 06/04/2015  . HTN (hypertension) 06/04/2015    Orientation RESPIRATION BLADDER Height & Weight     Self  Normal Incontinent Weight: 141 lb (64 kg) Height:     BEHAVIORAL SYMPTOMS/MOOD NEUROLOGICAL BOWEL NUTRITION STATUS      Incontinent Diet(normal)  AMBULATORY STATUS COMMUNICATION OF NEEDS Skin     Verbally Normal                       Personal Care Assistance Level of Assistance  Bathing, Feeding, Dressing, Total care Bathing Assistance: Limited assistance Feeding assistance: Limited assistance Dressing Assistance: Limited assistance Total Care Assistance: Limited assistance   Functional Limitations Info  Sight, Hearing, Speech Sight Info: Adequate(glasses) Hearing Info: Adequate Speech Info: Adequate(Altered recently since UTI)    SPECIAL CARE FACTORS FREQUENCY                       Contractures Contractures Info: Not present    Additional Factors Info  Code Status Code Status Info: DNR as per Dr Judithann Sheen note              Current Medications (12/20/2017):  This is the current hospital active medication list Current Facility-Administered Medications  Medication Dose Route Frequency Provider Last Rate Last Dose  . 0.45 % sodium chloride infusion   Intravenous Continuous Marguarite Arbour, MD      . cefTRIAXone (ROCEPHIN) 1 g in sodium chloride 0.9 % 100 mL IVPB  1 g Intravenous Q24H Aram Beecham D, MD 200 mL/hr at 12/20/17 1217 1 g at 12/20/17 1217  . hydrALAZINE (APRESOLINE) injection 10 mg  10 mg Intravenous Q4H PRN Marguarite Arbour, MD      . LORazepam (ATIVAN) injection 0.5 mg  0.5 mg Intravenous Q4H PRN Marguarite Arbour, MD      . pantoprazole (PROTONIX) injection 40 mg  40 mg Intravenous Q12H Marguarite Arbour, MD       Current Outpatient Medications  Medication Sig Dispense Refill  . aspirin 81 MG chewable tablet Chew 81 mg by mouth daily.    . bimatoprost (LUMIGAN) 0.01 % SOLN Place 1 drop into the right eye at bedtime.    . busPIRone (BUSPAR) 10 MG tablet Take 10 mg by mouth 2 (two) times daily.     . cephALEXin (KEFLEX) 250 MG capsule Take 1 capsule (250 mg total) by mouth 3 (three) times daily for 10 days. 30 capsule 0  . divalproex (DEPAKOTE SPRINKLE) 125 MG capsule Take 250 mg by mouth 2 (two) times daily.    Marland Kitchen  docusate sodium (COLACE) 100 MG capsule Take 100 mg by mouth 2 (two) times daily as needed for mild constipation or moderate constipation.    . feeding supplement, ENSURE ENLIVE, (ENSURE ENLIVE) LIQD Take 1 Bottle by mouth 2 (two) times daily between meals. Liquid vanilla with meals-0.046-1.05    . hydrALAZINE (APRESOLINE) 10 MG tablet Take 10 mg by mouth 3 (three) times daily.     Marland Kitchen. levETIRAcetam (KEPPRA) 500 MG tablet Take 500 mg by mouth 2 (two) times daily.    Marland Kitchen. LORazepam (ATIVAN) 0.5 MG tablet Take 0.5 mg by mouth 2 (two) times daily as needed for anxiety.    . Multiple Vitamins-Minerals (PRESERVISION AREDS) TABS Take 1 tablet by mouth 2 (two) times daily.    . naproxen sodium  (ANAPROX) 220 MG tablet Take 220 mg by mouth 2 (two) times daily with a meal.     . Polyethyl Glycol-Propyl Glycol (SYSTANE ULTRA) 0.4-0.3 % SOLN Place 1 drop into both eyes 4 (four) times daily.    Marland Kitchen. tolterodine (DETROL) 1 MG tablet Take 1 mg by mouth every other day.    Marland Kitchen. acetaminophen (TYLENOL) 500 MG tablet Take 500 mg by mouth every 6 (six) hours as needed for mild pain, moderate pain, fever or headache.    . brimonidine-timolol (COMBIGAN) 0.2-0.5 % ophthalmic solution Place 1 drop into the right eye 2 (two) times daily.     . Dimethicone (BAZA CLEANSE & PROTECT EX) Apply 1 application topically 3 (three) times daily as needed.    . hydroxypropyl methylcellulose / hypromellose (ISOPTO TEARS / GONIOVISC) 2.5 % ophthalmic solution Place 1 drop into both eyes 2 (two) times daily as needed for dry eyes.    Marland Kitchen. neomycin-bacitracin-polymyxin (NEOSPORIN) OINT Apply 1 application topically 2 (two) times daily.    Marland Kitchen. nystatin ointment (MYCOSTATIN) Apply 1 application topically 2 (two) times daily as needed (for rash/irritated skin area). Apply to groin and inner thighs       Discharge Medications: Please see discharge summary for a list of discharge medications.  Relevant Imaging Results:  Relevant Lab Results:   Additional Information  SSN [id number]  Arrie SenateBandi, Gertie Broerman [place], [place]

## 2017-12-20 NOTE — Clinical Social Work Note (Signed)
CSW is following for possible comfort care/hospice home needs. CSW is aware that the patient has admitted from the [facility] and was assessed by the ED CSW. CSW is receiving this patient from the ED CSW care.  Argentina PonderKaren Martha Gerry Blanchfield, MSW, Theresia MajorsLCSWA [phone]

## 2017-12-20 NOTE — H&P (Signed)
History and Physical    [name] ZOX:[id number] DOB: 1920/03/28 DOA: 12/20/2017  Referring physician: Dr. Shaune Pollack PCP: Jaclyn Shaggy, MD  Specialists: none  Chief Complaint: unwitnessed fall with head trauma  HPI: [name] is a 84 y.o. female has a past medical history significant for HTN, CAD, and dementia from ALF after unwitnessed fall with head trauma. Pt has been treated recently for UTI at the facility. No with right facial and head trauma with multiple small subdural hematomas noted on CT. She is confused and agitated. BP elevated. She is DNR. Spoke with POA(neice) who agrees with DNR status and comfort care if situation worsens. She is now admitted. UTI still present by current labs.  Review of Systems: unable to obtain from patient due to confusion  Past Medical History:  Diagnosis Date  . Anxiety   . Coronary artery disease   . Dehydration   . Glaucoma   . Hypertension   . Macular degeneration   . Memory change   . Seizures (HCC)    History reviewed. No pertinent surgical history. Social History:  reports that  has never smoked. she has never used smokeless tobacco. She reports that she does not drink alcohol or use drugs.  No Known Allergies  Family History  Family history unknown: Yes    Prior to Admission medications   Medication Sig Start Date End Date Taking? Authorizing Provider  aspirin 81 MG chewable tablet Chew 81 mg by mouth daily.   Yes [provider]  bimatoprost (LUMIGAN) 0.01 % SOLN Place 1 drop into the right eye at bedtime.   Yes [provider]  busPIRone (BUSPAR) 10 MG tablet Take 10 mg by mouth 2 (two) times daily.    Yes [provider]  cephALEXin (KEFLEX) 250 MG capsule Take 1 capsule (250 mg total) by mouth 3 (three) times daily for 10 days. 12/18/17 12/28/17 Yes Merrily Brittle, MD  divalproex (DEPAKOTE SPRINKLE) 125 MG capsule Take 250 mg by mouth 2 (two) times daily.   Yes [provider]   docusate sodium (COLACE) 100 MG capsule Take 100 mg by mouth 2 (two) times daily as needed for mild constipation or moderate constipation.   Yes [provider]  feeding supplement, ENSURE ENLIVE, (ENSURE ENLIVE) LIQD Take 1 Bottle by mouth 2 (two) times daily between meals. Liquid vanilla with meals-0.046-1.05   Yes [provider]  hydrALAZINE (APRESOLINE) 10 MG tablet Take 10 mg by mouth 3 (three) times daily.    Yes [provider]  levETIRAcetam (KEPPRA) 500 MG tablet Take 500 mg by mouth 2 (two) times daily.   Yes [provider]  LORazepam (ATIVAN) 0.5 MG tablet Take 0.5 mg by mouth 2 (two) times daily as needed for anxiety.   Yes [provider]  Multiple Vitamins-Minerals (PRESERVISION AREDS) TABS Take 1 tablet by mouth 2 (two) times daily.   Yes [provider]  naproxen sodium (ANAPROX) 220 MG tablet Take 220 mg by mouth 2 (two) times daily with a meal.    Yes [provider]  Polyethyl Glycol-Propyl Glycol (SYSTANE ULTRA) 0.4-0.3 % SOLN Place 1 drop into both eyes 4 (four) times daily.   Yes [provider]  tolterodine (DETROL) 1 MG tablet Take 1 mg by mouth every other day.   Yes [provider]  acetaminophen (TYLENOL) 500 MG tablet Take 500 mg by mouth every 6 (six) hours as needed for mild pain, moderate pain, fever or headache.  [provider]  brimonidine-timolol (COMBIGAN) 0.2-0.5 % ophthalmic solution Place 1 drop into the right eye 2 (two) times daily.     [provider]  Dimethicone (BAZA CLEANSE & PROTECT EX) Apply 1 application topically 3 (three) times daily as needed.    [provider]  hydroxypropyl methylcellulose / hypromellose (ISOPTO TEARS / GONIOVISC) 2.5 % ophthalmic solution Place 1 drop into both eyes 2 (two) times daily as needed for dry eyes.    [provider]  neomycin-bacitracin-polymyxin (NEOSPORIN) OINT Apply 1 application topically 2  (two) times daily.    [provider]  nystatin ointment (MYCOSTATIN) Apply 1 application topically 2 (two) times daily as needed (for rash/irritated skin area). Apply to groin and inner thighs    [provider]   Physical Exam: Vitals:   12/20/17 0813 12/20/17 0825 12/20/17 0834  BP:   (!) 151/76  Pulse:   (!) 108  Resp:   18  Temp: 97.9 F (36.6 C)    TempSrc: Rectal    SpO2:   97%  Weight:  64 kg (141 lb)      General:  No apparent distress, WDWN, right facial and head trauma noted with bruising  Eyes: PERRL, EOMI, no scleral icterus, conjunctiva clear  ENT: moist oropharynx without exudate, TM's benign, dentition poor  Neck: supple, no lymphadenopathy. Bilateral bruits. No thyromegaly  Cardiovascular: regular rate without MRG; 2+ peripheral pulses, no JVD, no peripheral edema  Respiratory: CTA biL, good air movement without wheezing, rhonchi or crackled. Respiratory effort normal  Abdomen: soft, non tender to palpation, positive bowel sounds, no guarding, no rebound  Skin: no rashes or lesions  Musculoskeletal: normal bulk and tone, no joint swelling  Psychiatric: confused, agitated, minimally verbal  Neurologic: CN 2-12 grossly intact, Motor strength 5/5 in all 4 groups with symmetric DTR's and non-focal sensory exam  Labs on Admission:  Basic Metabolic Panel: Recent Labs  Lab 12/18/17 1445 12/20/17 0837  NA 142 147*  K 5.0 4.8  CL 110 116*  CO2 24 18*  GLUCOSE 100* 105*  BUN 31* 33*  CREATININE 1.81* 1.54*  CALCIUM 9.1 9.1   Liver Function Tests: Recent Labs  Lab 12/18/17 1445  AST 34  ALT 16  ALKPHOS 75  BILITOT 0.9  PROT 7.5  ALBUMIN 3.6   No results for input(s): LIPASE, AMYLASE in the last 168 hours. No results for input(s): AMMONIA in the last 168 hours. CBC: Recent Labs  Lab 12/18/17 1445 12/20/17 0837  WBC 5.8 6.0  NEUTROABS 3.2 3.4  HGB 12.3 12.6  HCT 37.5 39.4  MCV 85.8 87.3  PLT 158 156   Cardiac  Enzymes: Recent Labs  Lab 12/18/17 1445 12/20/17 0843  CKTOTAL  --  58  TROPONINI <0.03 <0.03    BNP (last 3 results) No results for input(s): BNP in the last 8760 hours.  ProBNP (last 3 results) No results for input(s): PROBNP in the last 8760 hours.  CBG: No results for input(s): GLUCAP in the last 168 hours.  Radiological Exams on Admission: Ct Head Wo Contrast  Result Date: 12/20/2017 CLINICAL DATA:  84 year old female with altered mental status. Found on ground. Hematoma over RIGHT cheek. EXAM: EXAM CT HEAD WITHOUT CONTRAST CT MAXILLOFACIAL WITHOUT CONTRAST CT CERVICAL SPINE WITHOUT CONTRAST TECHNIQUE: Multidetector CT imaging of the head, cervical spine, and maxillofacial structures were performed using the standard protocol without intravenous contrast. Multiplanar CT image reconstructions of the cervical spine and maxillofacial structures were also generated. FINDINGS:  CT HEAD FINDINGS Brain: Shallow high-density extra-axial fluid collection extends over the RIGHT temporal lobe. This extra-axial hemorrhage measures 5 mm in depth extends over approximately 5 cm. There is effacement of the adjacent LEFT temporal RIGHT upper lobe. No midline shift or mass effect. Basal cisterns are patent. There is small subarachnoid hemorrhage in the high RIGHT parietal lobe (image 19, series 2 versus small parenchymal contusion. Very small subdural hematoma along the posterior RIGHT aspect posterior interhemispheric fissure (image 18/2). Vascular: No hyperdense vessel or unexpected calcification. Skull: No evidence skull fracture Other: Hematoma in the RIGHT chest is not completely imaged. CT MAXILLOFACIAL FINDINGS Osseous: Orbital walls are intact. Orbital orbital contents are normal. Maxillary sinus walls are intact. Pterygoid plates are normal. Mandibular condyles are located. No nasal bone fracture Orbits: Intraconal contents are normal.  Globes are normal. Sinuses: Clear Soft tissues: RIGHT cheek  hematoma measures 3.9 x 1.9 cm (image 36/10) CT CERVICAL SPINE FINDINGS Excessive patient motion required multiple scans of the cervical spine. Alignment: Normal alignment of the cervical vertebral bodies. Skull base and vertebrae: Normal craniocervical junction. No loss of vertebral body height or disc height. Normal facet articulation. No evidence of fracture. Soft tissues and spinal canal: No prevertebral soft tissue swelling. No perispinal or epidural hematoma. Disc levels:  Unremarkable Upper chest: Clear Other: None IMPRESSION: 1. Small acute RIGHT extra-axial hemorrhage favored subdural hematoma. 2. Small subdural hematoma along the posterior interhemispheric fissure on the RIGHT 3. Mild effacement of the RIGHT temporal lobe. No midline shift. Basal cisterns are patent. 4. Small parenchymal contusion versus subarachnoid hemorrhage over the RIGHT parietal lobe. 5. Extensive atrophy and white matter microvascular disease. 6. No facial bone fracture.  No skull fracture. 7. Large RIGHT cheek soft tissue hematoma 8. No cervical spine fracture Critical Value/emergent results were called by telephone at the time of interpretation on 12/20/2017 at 11:11 am to Dr. Governor Rooks , who verbally acknowledged these results. Launch template CT CT head max cervical Electronically Signed   By: Genevive Bi M.D.   On: 12/20/2017 11:13   Ct Head Wo Contrast  Result Date: 12/18/2017 CLINICAL DATA:  Altered level of consciousness, confusion EXAM: CT HEAD WITHOUT CONTRAST TECHNIQUE: Contiguous axial images were obtained from the base of the skull through the vertex without intravenous contrast. COMPARISON:  CT brain scan of 02/11/2017 FINDINGS: Brain: The ventricular system remains prominent as do the cortical sulci, consistent with diffuse atrophy. Cavum septum pellucidum, a normal variation, is stable. Moderately severe small vessel ischemic change is again noted throughout the periventricular white matter which may have  progressed somewhat in the interval. However, no acute hemorrhage, mass lesion, or infarction is seen. Vascular: No vascular abnormality is noted on this unenhanced study. Skull: On bone window images, no calvarial abnormality is seen. Sinuses/Orbits: The paranasal sinuses are well pneumatized. No air-fluid level is seen. Other: None. IMPRESSION: 1. No acute intracranial abnormality. 2. Little change to slight progression of moderately severe small vessel ischemic change throughout the periventricular white matter. Electronically Signed   By: Dwyane Dee M.D.   On: 12/18/2017 15:20   Ct Cervical Spine Wo Contrast  Result Date: 12/20/2017 CLINICAL DATA:  84 year old female with altered mental status. Found on ground. Hematoma over RIGHT cheek. EXAM: EXAM CT HEAD WITHOUT CONTRAST CT MAXILLOFACIAL WITHOUT CONTRAST CT CERVICAL SPINE WITHOUT CONTRAST TECHNIQUE: Multidetector CT imaging of the head, cervical spine, and maxillofacial structures were performed using the standard protocol without intravenous contrast. Multiplanar CT image reconstructions of the cervical  spine and maxillofacial structures were also generated. FINDINGS: CT HEAD FINDINGS Brain: Shallow high-density extra-axial fluid collection extends over the RIGHT temporal lobe. This extra-axial hemorrhage measures 5 mm in depth extends over approximately 5 cm. There is effacement of the adjacent LEFT temporal RIGHT upper lobe. No midline shift or mass effect. Basal cisterns are patent. There is small subarachnoid hemorrhage in the high RIGHT parietal lobe (image 19, series 2 versus small parenchymal contusion. Very small subdural hematoma along the posterior RIGHT aspect posterior interhemispheric fissure (image 18/2). Vascular: No hyperdense vessel or unexpected calcification. Skull: No evidence skull fracture Other: Hematoma in the RIGHT chest is not completely imaged. CT MAXILLOFACIAL FINDINGS Osseous: Orbital walls are intact. Orbital orbital  contents are normal. Maxillary sinus walls are intact. Pterygoid plates are normal. Mandibular condyles are located. No nasal bone fracture Orbits: Intraconal contents are normal.  Globes are normal. Sinuses: Clear Soft tissues: RIGHT cheek hematoma measures 3.9 x 1.9 cm (image 36/10) CT CERVICAL SPINE FINDINGS Excessive patient motion required multiple scans of the cervical spine. Alignment: Normal alignment of the cervical vertebral bodies. Skull base and vertebrae: Normal craniocervical junction. No loss of vertebral body height or disc height. Normal facet articulation. No evidence of fracture. Soft tissues and spinal canal: No prevertebral soft tissue swelling. No perispinal or epidural hematoma. Disc levels:  Unremarkable Upper chest: Clear Other: None IMPRESSION: 1. Small acute RIGHT extra-axial hemorrhage favored subdural hematoma. 2. Small subdural hematoma along the posterior interhemispheric fissure on the RIGHT 3. Mild effacement of the RIGHT temporal lobe. No midline shift. Basal cisterns are patent. 4. Small parenchymal contusion versus subarachnoid hemorrhage over the RIGHT parietal lobe. 5. Extensive atrophy and white matter microvascular disease. 6. No facial bone fracture.  No skull fracture. 7. Large RIGHT cheek soft tissue hematoma 8. No cervical spine fracture Critical Value/emergent results were called by telephone at the time of interpretation on 12/20/2017 at 11:11 am to Dr. Governor Rooks , who verbally acknowledged these results. Launch template CT CT head max cervical Electronically Signed   By: Genevive Bi M.D.   On: 12/20/2017 11:13   Dg Chest Port 1 View  Result Date: 12/18/2017 CLINICAL DATA:  Cough and confusion. EXAM: PORTABLE CHEST 1 VIEW COMPARISON:  09/12/2017 FINDINGS: Stable cardiomegaly with moderate aortic atherosclerosis. Chronic mild diffuse interstitial prominence is Re demonstrated likely representing stigmata of chronic interstitial lung disease. Small right upper  lobe opacity is noted that may represent a summation of overlapping ribs and scapula. A small focus of pneumonia is not entirely excluded. There is no effusion or overt pulmonary edema. Osteoarthritis of the shoulders. IMPRESSION: 1. Small focus of opacity in the right upper lobe either representing a summation artifact from overlapping ribs and scapula or potentially a small focus of pneumonia. 2. Chronic mild interstitial prominence likely representing chronic interstitial lung disease. 3. Stable cardiomegaly with aortic atherosclerosis. Electronically Signed   By: Tollie Eth M.D.   On: 12/18/2017 15:22   Ct Maxillofacial Wo Contrast  Result Date: 12/20/2017 CLINICAL DATA:  84 year old female with altered mental status. Found on ground. Hematoma over RIGHT cheek. EXAM: EXAM CT HEAD WITHOUT CONTRAST CT MAXILLOFACIAL WITHOUT CONTRAST CT CERVICAL SPINE WITHOUT CONTRAST TECHNIQUE: Multidetector CT imaging of the head, cervical spine, and maxillofacial structures were performed using the standard protocol without intravenous contrast. Multiplanar CT image reconstructions of the cervical spine and maxillofacial structures were also generated. FINDINGS: CT HEAD FINDINGS Brain: Shallow high-density extra-axial fluid collection extends over the RIGHT temporal lobe. This  extra-axial hemorrhage measures 5 mm in depth extends over approximately 5 cm. There is effacement of the adjacent LEFT temporal RIGHT upper lobe. No midline shift or mass effect. Basal cisterns are patent. There is small subarachnoid hemorrhage in the high RIGHT parietal lobe (image 19, series 2 versus small parenchymal contusion. Very small subdural hematoma along the posterior RIGHT aspect posterior interhemispheric fissure (image 18/2). Vascular: No hyperdense vessel or unexpected calcification. Skull: No evidence skull fracture Other: Hematoma in the RIGHT chest is not completely imaged. CT MAXILLOFACIAL FINDINGS Osseous: Orbital walls are intact.  Orbital orbital contents are normal. Maxillary sinus walls are intact. Pterygoid plates are normal. Mandibular condyles are located. No nasal bone fracture Orbits: Intraconal contents are normal.  Globes are normal. Sinuses: Clear Soft tissues: RIGHT cheek hematoma measures 3.9 x 1.9 cm (image 36/10) CT CERVICAL SPINE FINDINGS Excessive patient motion required multiple scans of the cervical spine. Alignment: Normal alignment of the cervical vertebral bodies. Skull base and vertebrae: Normal craniocervical junction. No loss of vertebral body height or disc height. Normal facet articulation. No evidence of fracture. Soft tissues and spinal canal: No prevertebral soft tissue swelling. No perispinal or epidural hematoma. Disc levels:  Unremarkable Upper chest: Clear Other: None IMPRESSION: 1. Small acute RIGHT extra-axial hemorrhage favored subdural hematoma. 2. Small subdural hematoma along the posterior interhemispheric fissure on the RIGHT 3. Mild effacement of the RIGHT temporal lobe. No midline shift. Basal cisterns are patent. 4. Small parenchymal contusion versus subarachnoid hemorrhage over the RIGHT parietal lobe. 5. Extensive atrophy and white matter microvascular disease. 6. No facial bone fracture.  No skull fracture. 7. Large RIGHT cheek soft tissue hematoma 8. No cervical spine fracture Critical Value/emergent results were called by telephone at the time of interpretation on 12/20/2017 at 11:11 am to Dr. Governor Rooks , who verbally acknowledged these results. Launch template CT CT head max cervical Electronically Signed   By: Genevive Bi M.D.   On: 12/20/2017 11:13    EKG: Independently reviewed.  Assessment/Plan Principal Problem:   SDH (subdural hematoma) (HCC) Active Problems:   Acute encephalopathy   HTN (hypertension)   UTI (urinary tract infection)   Will admit to floor as DNR and consult Neurology. Begin IV fluids and IV ABX for UTI. Cultures sent. Neuro checks q4h. CSW consult for  placement. Repeat labs in AM  Diet: soft Fluids: 1/2 NS@75  DVT Prophylaxis: TED Hose  Code Status: DNR  Family Communication: yes  Disposition Plan: SNF  Time spent: 50 min

## 2017-12-21 ENCOUNTER — Encounter: Payer: Self-pay | Admitting: Internal Medicine

## 2017-12-21 DIAGNOSIS — S065X9A Traumatic subdural hemorrhage with loss of consciousness of unspecified duration, initial encounter: Secondary | ICD-10-CM | POA: Diagnosis not present

## 2017-12-21 LAB — CBC
HEMATOCRIT: 34.2 % — AB (ref 35.0–47.0)
Hemoglobin: 11.3 g/dL — ABNORMAL LOW (ref 12.0–16.0)
MCH: 28.5 pg (ref 26.0–34.0)
MCHC: 33.2 g/dL (ref 32.0–36.0)
MCV: 85.9 fL (ref 80.0–100.0)
Platelets: 134 10*3/uL — ABNORMAL LOW (ref 150–440)
RBC: 3.98 MIL/uL (ref 3.80–5.20)
RDW: 14.9 % — ABNORMAL HIGH (ref 11.5–14.5)
WBC: 6.4 10*3/uL (ref 3.6–11.0)

## 2017-12-21 LAB — COMPREHENSIVE METABOLIC PANEL
ALT: 15 U/L (ref 14–54)
ANION GAP: 9 (ref 5–15)
AST: 28 U/L (ref 15–41)
Albumin: 3.3 g/dL — ABNORMAL LOW (ref 3.5–5.0)
Alkaline Phosphatase: 66 U/L (ref 38–126)
BILIRUBIN TOTAL: 0.8 mg/dL (ref 0.3–1.2)
BUN: 26 mg/dL — AB (ref 6–20)
CO2: 21 mmol/L — ABNORMAL LOW (ref 22–32)
Calcium: 8.8 mg/dL — ABNORMAL LOW (ref 8.9–10.3)
Chloride: 114 mmol/L — ABNORMAL HIGH (ref 101–111)
Creatinine, Ser: 1.39 mg/dL — ABNORMAL HIGH (ref 0.44–1.00)
GFR, EST AFRICAN AMERICAN: 36 mL/min — AB (ref >60)
GFR, EST NON AFRICAN AMERICAN: 31 mL/min — AB (ref >60)
Glucose, Bld: 127 mg/dL — ABNORMAL HIGH (ref 65–99)
POTASSIUM: 4.4 mmol/L (ref 3.5–5.1)
Sodium: 144 mmol/L (ref 135–145)
TOTAL PROTEIN: 6.9 g/dL (ref 6.5–8.1)

## 2017-12-21 MED ORDER — MORPHINE SULFATE (CONCENTRATE) 10 MG /0.5 ML PO SOLN: 10.0000 mg | ORAL | 0 refills | Status: AC | PRN

## 2017-12-21 MED ORDER — LORAZEPAM 2 MG/ML PO CONC: 1.0000 mg | ORAL | Status: DC | PRN

## 2017-12-21 MED ORDER — MORPHINE SULFATE (PF) 2 MG/ML IV SOLN
2.0000 mg | INTRAVENOUS | Status: DC | PRN
  Administered 2017-12-21 (×2): 2 mg via INTRAVENOUS
  Filled 2017-12-21 (×2): qty 1

## 2017-12-21 MED ORDER — ACETAMINOPHEN 650 MG RE SUPP: 650.0000 mg | Freq: Four times a day (QID) | RECTAL | Status: DC | PRN

## 2017-12-21 MED ORDER — ONDANSETRON HCL 4 MG/2ML IJ SOLN: 4.0000 mg | Freq: Four times a day (QID) | INTRAMUSCULAR | Status: DC | PRN

## 2017-12-21 MED ORDER — POLYVINYL ALCOHOL 1.4 % OP SOLN
1.0000 [drp] | Freq: Four times a day (QID) | OPHTHALMIC | Status: DC | PRN
  Filled 2017-12-21: qty 15

## 2017-12-21 MED ORDER — ACETAMINOPHEN 325 MG PO TABS: 650.0000 mg | ORAL_TABLET | Freq: Four times a day (QID) | ORAL | Status: DC | PRN

## 2017-12-21 MED ORDER — LORAZEPAM 2 MG/ML PO CONC: 1.0000 mg | ORAL | 0 refills | Status: AC | PRN

## 2017-12-21 MED ORDER — ONDANSETRON 4 MG PO TBDP
4.0000 mg | ORAL_TABLET | Freq: Four times a day (QID) | ORAL | Status: DC | PRN
  Filled 2017-12-21: qty 1

## 2017-12-21 MED ORDER — LORAZEPAM 1 MG PO TABS: 1.0000 mg | ORAL_TABLET | ORAL | Status: DC | PRN

## 2017-12-21 MED ORDER — OXYCODONE HCL 20 MG/ML PO CONC: 5.0000 mg | ORAL | Status: DC | PRN

## 2017-12-21 MED ORDER — ATROPINE SULFATE 1 % OP SOLN
4.0000 [drp] | OPHTHALMIC | Status: DC | PRN
  Filled 2017-12-21: qty 2

## 2017-12-21 MED ORDER — LORAZEPAM 2 MG/ML IJ SOLN: 1.0000 mg | INTRAMUSCULAR | Status: DC | PRN

## 2017-12-21 NOTE — Plan of Care (Signed)
Pt responsive to voice only. Does not follow commands at this time. Ativan given x1 for anxiety. No signs or symptoms of pain. HCPOA at bedside throughout the night.

## 2017-12-21 NOTE — Discharge Summary (Signed)
[facility] [place] [facility]   PATIENT NAME: [name]    MR#:  [id number]  DATE OF BIRTH:  1920/04/25  DATE OF ADMISSION:  12/20/2017 ADMITTING PHYSICIAN: Marguarite Arbour, MD  DATE OF DISCHARGE: No discharge date for patient encounter.  PRIMARY CARE PHYSICIAN: Jaclyn Shaggy, MD   ADMISSION DIAGNOSIS:  Subdural hematoma (HCC) [S06.5X9A] Facial contusion, initial encounter [S00.83XA] Unwitnessed fall [R29.6] Chronic renal failure, unspecified CKD stage [N18.9]  DISCHARGE DIAGNOSIS:  Principal Problem:   SDH (subdural hematoma) (HCC) Active Problems:   Acute encephalopathy   HTN (hypertension)   UTI (urinary tract infection)   SECONDARY DIAGNOSIS:   Past Medical History:  Diagnosis Date  . Anxiety   . Coronary artery disease   . Dehydration   . Glaucoma   . Hypertension   . Macular degeneration   . Memory change   . Seizures (HCC)      ADMITTING HISTORY  HPI: [name] is a 84 y.o. female has a past medical history significant for HTN, CAD, and dementia from ALF after unwitnessed fall with head trauma. Pt has been treated recently for UTI at the facility. No with right facial and head trauma with multiple small subdural hematomas noted on CT. She is confused and agitated. BP elevated. She is DNR. Spoke with POA(neice) who agrees with DNR status and comfort care if situation worsens. She is now admitted. UTI still present by current labs  HOSPITAL COURSE:   *SDH (subdural hematoma) * Acute metabolic encephalopathy * HTN (hypertension) * UTI (urinary tract infection)  * CAD  Patient was admitted to medical floor on IV fluids and IV antibiotics.  Patient did not improve with these measures and after further discussing with her healthcare power of attorney her niece patient has been transitioned to comfort measures.  She is on as needed morphine and Ativan.  Life expectancy less than 1 week.  Will be discharged to hospice  home.  CONSULTS OBTAINED:  Treatment Team:  Thana Farr, MD  DRUG ALLERGIES:  No Known Allergies  DISCHARGE MEDICATIONS:   Allergies as of 12/21/2017   No Known Allergies     Medication List    STOP taking these medications   acetaminophen 500 MG tablet Commonly known as:  TYLENOL   aspirin 81 MG chewable tablet   BAZA CLEANSE & PROTECT EX   bimatoprost 0.01 % Soln Commonly known as:  LUMIGAN   busPIRone 10 MG tablet Commonly known as:  BUSPAR   cephALEXin 250 MG capsule Commonly known as:  KEFLEX   COMBIGAN 0.2-0.5 % ophthalmic solution Generic drug:  brimonidine-timolol   divalproex 125 MG capsule Commonly known as:  DEPAKOTE SPRINKLE   docusate sodium 100 MG capsule Commonly known as:  COLACE   feeding supplement (ENSURE ENLIVE) Liqd   hydrALAZINE 10 MG tablet Commonly known as:  APRESOLINE   hydroxypropyl methylcellulose / hypromellose 2.5 % ophthalmic solution Commonly known as:  ISOPTO TEARS / GONIOVISC   levETIRAcetam 500 MG tablet Commonly known as:  KEPPRA   LORazepam 0.5 MG tablet Commonly known as:  ATIVAN Replaced by:  LORazepam 2 MG/ML concentrated solution   naproxen sodium 220 MG tablet Commonly known as:  ALEVE   neomycin-bacitracin-polymyxin Oint Commonly known as:  NEOSPORIN   nystatin ointment Commonly known as:  MYCOSTATIN   PRESERVISION AREDS Tabs   SYSTANE ULTRA 0.4-0.3 % Soln Generic drug:  Polyethyl Glycol-Propyl Glycol   tolterodine 1 MG tablet Commonly known as:  DETROL  TAKE these medications   LORazepam 2 MG/ML concentrated solution Commonly known as:  ATIVAN Place 0.5 mLs (1 mg total) under the tongue every 4 (four) hours as needed for anxiety. Replaces:  LORazepam 0.5 MG tablet   morphine CONCENTRATE 10 mg / 0.5 ml concentrated solution Take 0.5 mLs (10 mg total) by mouth every 3 (three) hours as needed for severe pain or shortness of breath.       Today   VITAL SIGNS:  Blood pressure (!)  175/71, pulse 99, temperature 97.6 F (36.4 C), temperature source Oral, resp. rate (!) 26, height 5\' 5"  (1.651 m), weight 67 kg (147 lb 12.8 oz), SpO2 98 %.  I/O:    Intake/Output Summary (Last 24 hours) at 12/21/2017 1308 Last data filed at 12/21/2017 0934 Gross per 24 hour  Intake 2363.88 ml  Output -  Net 2363.88 ml    PHYSICAL EXAMINATION:  Physical Exam  GENERAL:  84 y.o.-year-old patient lying in the bed  LUNGS: Shallow breathing.  Tachypnea. CARDIOVASCULAR: S1, S2  PSYCHIATRIC: The patient is lethargic SKIN: Bruising on the face  DATA REVIEW:   CBC Recent Labs  Lab 12/21/17 0505  WBC 6.4  HGB 11.3*  HCT 34.2*  PLT 134*    Chemistries  Recent Labs  Lab 12/21/17 0505  NA 144  K 4.4  CL 114*  CO2 21*  GLUCOSE 127*  BUN 26*  CREATININE 1.39*  CALCIUM 8.8*  AST 28  ALT 15  ALKPHOS 66  BILITOT 0.8    Cardiac Enzymes Recent Labs  Lab 12/20/17 0843  TROPONINI <0.03    Microbiology Results  Results for orders placed or performed during the hospital encounter of 12/18/17  Urine culture     Status: Abnormal   Collection Time: 12/18/17  2:44 PM  Result Value Ref Range Status   Specimen Description   Final    URINE, RANDOM Performed at [facility], [address] [place], [place] [address]    Special Requests   Final    NONE Performed at [facility], [address] [place], [place] [address]    Culture >=100,000 COLONIES/mL KLEBSIELLA OXYTOCA (A)  Final   Report Status 12/20/2017 FINAL  Final   Organism ID, Bacteria KLEBSIELLA OXYTOCA (A)  Final      Susceptibility   Klebsiella oxytoca - MIC*    AMPICILLIN >=32 RESISTANT Resistant     CEFAZOLIN >=64 RESISTANT Resistant     CEFTRIAXONE <=1 SENSITIVE Sensitive     CIPROFLOXACIN <=0.25 SENSITIVE Sensitive     GENTAMICIN <=1 SENSITIVE Sensitive     IMIPENEM <=0.25 SENSITIVE Sensitive     NITROFURANTOIN 64 INTERMEDIATE Intermediate     TRIMETH/SULFA <=20 SENSITIVE  Sensitive     AMPICILLIN/SULBACTAM 16 INTERMEDIATE Intermediate     PIP/TAZO <=4 SENSITIVE Sensitive     Extended ESBL NEGATIVE Sensitive     * >=100,000 COLONIES/mL KLEBSIELLA OXYTOCA    RADIOLOGY:  Ct Head Wo Contrast  Result Date: 12/20/2017 CLINICAL DATA:  84 year old female with altered mental status. Found on ground. Hematoma over RIGHT cheek. EXAM: EXAM CT HEAD WITHOUT CONTRAST CT MAXILLOFACIAL WITHOUT CONTRAST CT CERVICAL SPINE WITHOUT CONTRAST TECHNIQUE: Multidetector CT imaging of the head, cervical spine, and maxillofacial structures were performed using the standard protocol without intravenous contrast. Multiplanar CT image reconstructions of the cervical spine and maxillofacial structures were also generated. FINDINGS: CT HEAD FINDINGS Brain: Shallow high-density extra-axial fluid collection extends over the RIGHT temporal lobe. This extra-axial hemorrhage measures 5  mm in depth extends over approximately 5 cm. There is effacement of the adjacent LEFT temporal RIGHT upper lobe. No midline shift or mass effect. Basal cisterns are patent. There is small subarachnoid hemorrhage in the high RIGHT parietal lobe (image 19, series 2 versus small parenchymal contusion. Very small subdural hematoma along the posterior RIGHT aspect posterior interhemispheric fissure (image 18/2). Vascular: No hyperdense vessel or unexpected calcification. Skull: No evidence skull fracture Other: Hematoma in the RIGHT chest is not completely imaged. CT MAXILLOFACIAL FINDINGS Osseous: Orbital walls are intact. Orbital orbital contents are normal. Maxillary sinus walls are intact. Pterygoid plates are normal. Mandibular condyles are located. No nasal bone fracture Orbits: Intraconal contents are normal.  Globes are normal. Sinuses: Clear Soft tissues: RIGHT cheek hematoma measures 3.9 x 1.9 cm (image 36/10) CT CERVICAL SPINE FINDINGS Excessive patient motion required multiple scans of the cervical spine. Alignment: Normal  alignment of the cervical vertebral bodies. Skull base and vertebrae: Normal craniocervical junction. No loss of vertebral body height or disc height. Normal facet articulation. No evidence of fracture. Soft tissues and spinal canal: No prevertebral soft tissue swelling. No perispinal or epidural hematoma. Disc levels:  Unremarkable Upper chest: Clear Other: None IMPRESSION: 1. Small acute RIGHT extra-axial hemorrhage favored subdural hematoma. 2. Small subdural hematoma along the posterior interhemispheric fissure on the RIGHT 3. Mild effacement of the RIGHT temporal lobe. No midline shift. Basal cisterns are patent. 4. Small parenchymal contusion versus subarachnoid hemorrhage over the RIGHT parietal lobe. 5. Extensive atrophy and white matter microvascular disease. 6. No facial bone fracture.  No skull fracture. 7. Large RIGHT cheek soft tissue hematoma 8. No cervical spine fracture Critical Value/emergent results were called by telephone at the time of interpretation on 12/20/2017 at 11:11 am to Dr. Governor RooksEBECCA LORD , who verbally acknowledged these results. Launch template CT CT head max cervical Electronically Signed   By: Genevive BiStewart  Edmunds M.D.   On: 12/20/2017 11:13   Ct Cervical Spine Wo Contrast  Result Date: 12/20/2017 CLINICAL DATA:  84 year old female with altered mental status. Found on ground. Hematoma over RIGHT cheek. EXAM: EXAM CT HEAD WITHOUT CONTRAST CT MAXILLOFACIAL WITHOUT CONTRAST CT CERVICAL SPINE WITHOUT CONTRAST TECHNIQUE: Multidetector CT imaging of the head, cervical spine, and maxillofacial structures were performed using the standard protocol without intravenous contrast. Multiplanar CT image reconstructions of the cervical spine and maxillofacial structures were also generated. FINDINGS: CT HEAD FINDINGS Brain: Shallow high-density extra-axial fluid collection extends over the RIGHT temporal lobe. This extra-axial hemorrhage measures 5 mm in depth extends over approximately 5 cm. There  is effacement of the adjacent LEFT temporal RIGHT upper lobe. No midline shift or mass effect. Basal cisterns are patent. There is small subarachnoid hemorrhage in the high RIGHT parietal lobe (image 19, series 2 versus small parenchymal contusion. Very small subdural hematoma along the posterior RIGHT aspect posterior interhemispheric fissure (image 18/2). Vascular: No hyperdense vessel or unexpected calcification. Skull: No evidence skull fracture Other: Hematoma in the RIGHT chest is not completely imaged. CT MAXILLOFACIAL FINDINGS Osseous: Orbital walls are intact. Orbital orbital contents are normal. Maxillary sinus walls are intact. Pterygoid plates are normal. Mandibular condyles are located. No nasal bone fracture Orbits: Intraconal contents are normal.  Globes are normal. Sinuses: Clear Soft tissues: RIGHT cheek hematoma measures 3.9 x 1.9 cm (image 36/10) CT CERVICAL SPINE FINDINGS Excessive patient motion required multiple scans of the cervical spine. Alignment: Normal alignment of the cervical vertebral bodies. Skull base and vertebrae: Normal craniocervical junction. No loss  of vertebral body height or disc height. Normal facet articulation. No evidence of fracture. Soft tissues and spinal canal: No prevertebral soft tissue swelling. No perispinal or epidural hematoma. Disc levels:  Unremarkable Upper chest: Clear Other: None IMPRESSION: 1. Small acute RIGHT extra-axial hemorrhage favored subdural hematoma. 2. Small subdural hematoma along the posterior interhemispheric fissure on the RIGHT 3. Mild effacement of the RIGHT temporal lobe. No midline shift. Basal cisterns are patent. 4. Small parenchymal contusion versus subarachnoid hemorrhage over the RIGHT parietal lobe. 5. Extensive atrophy and white matter microvascular disease. 6. No facial bone fracture.  No skull fracture. 7. Large RIGHT cheek soft tissue hematoma 8. No cervical spine fracture Critical Value/emergent results were called by  telephone at the time of interpretation on 12/20/2017 at 11:11 am to Dr. Governor Rooks , who verbally acknowledged these results. Launch template CT CT head max cervical Electronically Signed   By: Genevive Bi M.D.   On: 12/20/2017 11:13   Ct Maxillofacial Wo Contrast  Result Date: 12/20/2017 CLINICAL DATA:  84 year old female with altered mental status. Found on ground. Hematoma over RIGHT cheek. EXAM: EXAM CT HEAD WITHOUT CONTRAST CT MAXILLOFACIAL WITHOUT CONTRAST CT CERVICAL SPINE WITHOUT CONTRAST TECHNIQUE: Multidetector CT imaging of the head, cervical spine, and maxillofacial structures were performed using the standard protocol without intravenous contrast. Multiplanar CT image reconstructions of the cervical spine and maxillofacial structures were also generated. FINDINGS: CT HEAD FINDINGS Brain: Shallow high-density extra-axial fluid collection extends over the RIGHT temporal lobe. This extra-axial hemorrhage measures 5 mm in depth extends over approximately 5 cm. There is effacement of the adjacent LEFT temporal RIGHT upper lobe. No midline shift or mass effect. Basal cisterns are patent. There is small subarachnoid hemorrhage in the high RIGHT parietal lobe (image 19, series 2 versus small parenchymal contusion. Very small subdural hematoma along the posterior RIGHT aspect posterior interhemispheric fissure (image 18/2). Vascular: No hyperdense vessel or unexpected calcification. Skull: No evidence skull fracture Other: Hematoma in the RIGHT chest is not completely imaged. CT MAXILLOFACIAL FINDINGS Osseous: Orbital walls are intact. Orbital orbital contents are normal. Maxillary sinus walls are intact. Pterygoid plates are normal. Mandibular condyles are located. No nasal bone fracture Orbits: Intraconal contents are normal.  Globes are normal. Sinuses: Clear Soft tissues: RIGHT cheek hematoma measures 3.9 x 1.9 cm (image 36/10) CT CERVICAL SPINE FINDINGS Excessive patient motion required multiple  scans of the cervical spine. Alignment: Normal alignment of the cervical vertebral bodies. Skull base and vertebrae: Normal craniocervical junction. No loss of vertebral body height or disc height. Normal facet articulation. No evidence of fracture. Soft tissues and spinal canal: No prevertebral soft tissue swelling. No perispinal or epidural hematoma. Disc levels:  Unremarkable Upper chest: Clear Other: None IMPRESSION: 1. Small acute RIGHT extra-axial hemorrhage favored subdural hematoma. 2. Small subdural hematoma along the posterior interhemispheric fissure on the RIGHT 3. Mild effacement of the RIGHT temporal lobe. No midline shift. Basal cisterns are patent. 4. Small parenchymal contusion versus subarachnoid hemorrhage over the RIGHT parietal lobe. 5. Extensive atrophy and white matter microvascular disease. 6. No facial bone fracture.  No skull fracture. 7. Large RIGHT cheek soft tissue hematoma 8. No cervical spine fracture Critical Value/emergent results were called by telephone at the time of interpretation on 12/20/2017 at 11:11 am to Dr. Governor Rooks , who verbally acknowledged these results. Launch template CT CT head max cervical Electronically Signed   By: Genevive Bi M.D.   On: 12/20/2017 11:13    Follow up  with PCP in 1 week.  Management plans discussed with the patient, family and they are in agreement.  CODE STATUS:     Code Status Orders  (From admission, onward)        Start     Ordered   08-Jan-2018 0905  Do not attempt resuscitation (DNR)  Continuous    Question Answer Comment  In the event of cardiac or respiratory ARREST Do not call a "code blue"   In the event of cardiac or respiratory ARREST Do not perform Intubation, CPR, defibrillation or ACLS   In the event of cardiac or respiratory ARREST Use medication by any route, position, wound care, and other measures to relive pain and suffering. May use oxygen, suction and manual treatment of airway obstruction as needed  for comfort.   Comments RN may pronounce death      01-08-2018 1610    Code Status History    Date Active Date Inactive Code Status Order ID Comments User Context   12/20/2017 13:14 January 08, 2018 09:06 DNR [id number]  Marguarite Arbour, MD Inpatient   06/04/2015 08:41 06/06/2015 16:04 DNR [id number]  Crissie Figures, MD Inpatient    Advance Directive Documentation     Most Recent Value  Type of Advance Directive  Healthcare Power of Attorney, Living will  Pre-existing out of facility DNR order (yellow form or pink MOST form)  Yellow form placed in chart (order not valid for inpatient use)  "MOST" Form in Place?  No data      TOTAL TIME TAKING CARE OF THIS PATIENT ON DAY OF DISCHARGE: more than 30 minutes.   Orie Fisherman M.D on January 08, 2018 at 1:08 PM  Between 7am to 6pm - Pager - [phone]  After 6pm go to [email or web address] - password [email or web address] [facility]  [facility] [place] [facility]  Office  [phone]  CC: Primary care physician; Jaclyn Shaggy, MD  Note: This dictation was prepared with Dragon dictation along with smaller phrase technology. Any transcriptional errors that result from this process are unintentional.

## 2017-12-21 NOTE — Progress Notes (Signed)
[facility] - [place] [facility]   PATIENT NAME: Jeanette PuffWinona Miles    MR#:  [id number]  DATE OF BIRTH:  02/10/1920  SUBJECTIVE:  CHIEF COMPLAINT:   Chief Complaint  Patient presents with  . Fall   Patient nonverbal.  Tachypneic.  Niece at bedside.  On IV fluids.  REVIEW OF SYSTEMS:    Review of Systems  Unable to perform ROS: Mental status change    DRUG ALLERGIES:  No Known Allergies  VITALS:  Blood pressure (!) 175/71, pulse 99, temperature 97.6 F (36.4 C), temperature source Oral, resp. rate (!) 26, height 5\' 5"  (1.651 m), weight 67 kg (147 lb 12.8 oz), SpO2 98 %.  PHYSICAL EXAMINATION:   Physical Exam  GENERAL:  84 y.o.-year-old patient lying in the bed  EYES: Pupils sluggish. HEENT: Head atraumatic, normocephalic. Oropharynx and nasopharynx clear.  NECK:  Supple, no jugular venous distention. No thyroid enlargement, no tenderness.  LUNGS: Coarse breath sounds.  Increased work of breathing.  Shallow. CARDIOVASCULAR: S1, S2 tachycardia ABDOMEN: Soft, nontender, nondistended. Bowel sounds present. No organomegaly or mass.  EXTREMITIES: No cyanosis, clubbing or edema b/l.    NEUROLOGIC: Drowsy and not following instructions.   PSYCHIATRIC: The patient is lethargic SKIN: Bruising  LABORATORY PANEL:   CBC Recent Labs  Lab 12/21/17 0505  WBC 6.4  HGB 11.3*  HCT 34.2*  PLT 134*   ------------------------------------------------------------------------------------------------------------------ Chemistries  Recent Labs  Lab 12/21/17 0505  NA 144  K 4.4  CL 114*  CO2 21*  GLUCOSE 127*  BUN 26*  CREATININE 1.39*  CALCIUM 8.8*  AST 28  ALT 15  ALKPHOS 66  BILITOT 0.8   ------------------------------------------------------------------------------------------------------------------  Cardiac Enzymes Recent Labs  Lab 12/20/17 0843  TROPONINI <0.03    ------------------------------------------------------------------------------------------------------------------  RADIOLOGY:  Ct Head Wo Contrast  Result Date: 12/20/2017 CLINICAL DATA:  84 year old female with altered mental status. Found on ground. Hematoma over RIGHT cheek. EXAM: EXAM CT HEAD WITHOUT CONTRAST CT MAXILLOFACIAL WITHOUT CONTRAST CT CERVICAL SPINE WITHOUT CONTRAST TECHNIQUE: Multidetector CT imaging of the head, cervical spine, and maxillofacial structures were performed using the standard protocol without intravenous contrast. Multiplanar CT image reconstructions of the cervical spine and maxillofacial structures were also generated. FINDINGS: CT HEAD FINDINGS Brain: Shallow high-density extra-axial fluid collection extends over the RIGHT temporal lobe. This extra-axial hemorrhage measures 5 mm in depth extends over approximately 5 cm. There is effacement of the adjacent LEFT temporal RIGHT upper lobe. No midline shift or mass effect. Basal cisterns are patent. There is small subarachnoid hemorrhage in the high RIGHT parietal lobe (image 19, series 2 versus small parenchymal contusion. Very small subdural hematoma along the posterior RIGHT aspect posterior interhemispheric fissure (image 18/2). Vascular: No hyperdense vessel or unexpected calcification. Skull: No evidence skull fracture Other: Hematoma in the RIGHT chest is not completely imaged. CT MAXILLOFACIAL FINDINGS Osseous: Orbital walls are intact. Orbital orbital contents are normal. Maxillary sinus walls are intact. Pterygoid plates are normal. Mandibular condyles are located. No nasal bone fracture Orbits: Intraconal contents are normal.  Globes are normal. Sinuses: Clear Soft tissues: RIGHT cheek hematoma measures 3.9 x 1.9 cm (image 36/10) CT CERVICAL SPINE FINDINGS Excessive patient motion required multiple scans of the cervical spine. Alignment: Normal alignment of the cervical vertebral bodies. Skull base and vertebrae:  Normal craniocervical junction. No loss of vertebral body height or disc height. Normal facet articulation. No evidence of fracture. Soft tissues and spinal canal: No prevertebral soft tissue swelling. No perispinal or epidural hematoma.  Disc levels:  Unremarkable Upper chest: Clear Other: None IMPRESSION: 1. Small acute RIGHT extra-axial hemorrhage favored subdural hematoma. 2. Small subdural hematoma along the posterior interhemispheric fissure on the RIGHT 3. Mild effacement of the RIGHT temporal lobe. No midline shift. Basal cisterns are patent. 4. Small parenchymal contusion versus subarachnoid hemorrhage over the RIGHT parietal lobe. 5. Extensive atrophy and white matter microvascular disease. 6. No facial bone fracture.  No skull fracture. 7. Large RIGHT cheek soft tissue hematoma 8. No cervical spine fracture Critical Value/emergent results were called by telephone at the time of interpretation on 12/20/2017 at 11:11 am to Dr. Governor Rooks , who verbally acknowledged these results. Launch template CT CT head max cervical Electronically Signed   By: Genevive Bi M.D.   On: 12/20/2017 11:13   Ct Cervical Spine Wo Contrast  Result Date: 12/20/2017 CLINICAL DATA:  84 year old female with altered mental status. Found on ground. Hematoma over RIGHT cheek. EXAM: EXAM CT HEAD WITHOUT CONTRAST CT MAXILLOFACIAL WITHOUT CONTRAST CT CERVICAL SPINE WITHOUT CONTRAST TECHNIQUE: Multidetector CT imaging of the head, cervical spine, and maxillofacial structures were performed using the standard protocol without intravenous contrast. Multiplanar CT image reconstructions of the cervical spine and maxillofacial structures were also generated. FINDINGS: CT HEAD FINDINGS Brain: Shallow high-density extra-axial fluid collection extends over the RIGHT temporal lobe. This extra-axial hemorrhage measures 5 mm in depth extends over approximately 5 cm. There is effacement of the adjacent LEFT temporal RIGHT upper lobe. No  midline shift or mass effect. Basal cisterns are patent. There is small subarachnoid hemorrhage in the high RIGHT parietal lobe (image 19, series 2 versus small parenchymal contusion. Very small subdural hematoma along the posterior RIGHT aspect posterior interhemispheric fissure (image 18/2). Vascular: No hyperdense vessel or unexpected calcification. Skull: No evidence skull fracture Other: Hematoma in the RIGHT chest is not completely imaged. CT MAXILLOFACIAL FINDINGS Osseous: Orbital walls are intact. Orbital orbital contents are normal. Maxillary sinus walls are intact. Pterygoid plates are normal. Mandibular condyles are located. No nasal bone fracture Orbits: Intraconal contents are normal.  Globes are normal. Sinuses: Clear Soft tissues: RIGHT cheek hematoma measures 3.9 x 1.9 cm (image 36/10) CT CERVICAL SPINE FINDINGS Excessive patient motion required multiple scans of the cervical spine. Alignment: Normal alignment of the cervical vertebral bodies. Skull base and vertebrae: Normal craniocervical junction. No loss of vertebral body height or disc height. Normal facet articulation. No evidence of fracture. Soft tissues and spinal canal: No prevertebral soft tissue swelling. No perispinal or epidural hematoma. Disc levels:  Unremarkable Upper chest: Clear Other: None IMPRESSION: 1. Small acute RIGHT extra-axial hemorrhage favored subdural hematoma. 2. Small subdural hematoma along the posterior interhemispheric fissure on the RIGHT 3. Mild effacement of the RIGHT temporal lobe. No midline shift. Basal cisterns are patent. 4. Small parenchymal contusion versus subarachnoid hemorrhage over the RIGHT parietal lobe. 5. Extensive atrophy and white matter microvascular disease. 6. No facial bone fracture.  No skull fracture. 7. Large RIGHT cheek soft tissue hematoma 8. No cervical spine fracture Critical Value/emergent results were called by telephone at the time of interpretation on 12/20/2017 at 11:11 am to Dr.  Governor Rooks , who verbally acknowledged these results. Launch template CT CT head max cervical Electronically Signed   By: Genevive Bi M.D.   On: 12/20/2017 11:13   Ct Maxillofacial Wo Contrast  Result Date: 12/20/2017 CLINICAL DATA:  84 year old female with altered mental status. Found on ground. Hematoma over RIGHT cheek. EXAM: EXAM CT HEAD WITHOUT CONTRAST CT  MAXILLOFACIAL WITHOUT CONTRAST CT CERVICAL SPINE WITHOUT CONTRAST TECHNIQUE: Multidetector CT imaging of the head, cervical spine, and maxillofacial structures were performed using the standard protocol without intravenous contrast. Multiplanar CT image reconstructions of the cervical spine and maxillofacial structures were also generated. FINDINGS: CT HEAD FINDINGS Brain: Shallow high-density extra-axial fluid collection extends over the RIGHT temporal lobe. This extra-axial hemorrhage measures 5 mm in depth extends over approximately 5 cm. There is effacement of the adjacent LEFT temporal RIGHT upper lobe. No midline shift or mass effect. Basal cisterns are patent. There is small subarachnoid hemorrhage in the high RIGHT parietal lobe (image 19, series 2 versus small parenchymal contusion. Very small subdural hematoma along the posterior RIGHT aspect posterior interhemispheric fissure (image 18/2). Vascular: No hyperdense vessel or unexpected calcification. Skull: No evidence skull fracture Other: Hematoma in the RIGHT chest is not completely imaged. CT MAXILLOFACIAL FINDINGS Osseous: Orbital walls are intact. Orbital orbital contents are normal. Maxillary sinus walls are intact. Pterygoid plates are normal. Mandibular condyles are located. No nasal bone fracture Orbits: Intraconal contents are normal.  Globes are normal. Sinuses: Clear Soft tissues: RIGHT cheek hematoma measures 3.9 x 1.9 cm (image 36/10) CT CERVICAL SPINE FINDINGS Excessive patient motion required multiple scans of the cervical spine. Alignment: Normal alignment of the cervical  vertebral bodies. Skull base and vertebrae: Normal craniocervical junction. No loss of vertebral body height or disc height. Normal facet articulation. No evidence of fracture. Soft tissues and spinal canal: No prevertebral soft tissue swelling. No perispinal or epidural hematoma. Disc levels:  Unremarkable Upper chest: Clear Other: None IMPRESSION: 1. Small acute RIGHT extra-axial hemorrhage favored subdural hematoma. 2. Small subdural hematoma along the posterior interhemispheric fissure on the RIGHT 3. Mild effacement of the RIGHT temporal lobe. No midline shift. Basal cisterns are patent. 4. Small parenchymal contusion versus subarachnoid hemorrhage over the RIGHT parietal lobe. 5. Extensive atrophy and white matter microvascular disease. 6. No facial bone fracture.  No skull fracture. 7. Large RIGHT cheek soft tissue hematoma 8. No cervical spine fracture Critical Value/emergent results were called by telephone at the time of interpretation on 12/20/2017 at 11:11 am to Dr. Governor Rooks , who verbally acknowledged these results. Launch template CT CT head max cervical Electronically Signed   By: Genevive Bi M.D.   On: 12/20/2017 11:13     ASSESSMENT AND PLAN:    * SDH (subdural hematoma)   * Acute metabolic encephalopathy   * HTN (hypertension)   * UTI (urinary tract infection)  * CAD  Patient on IV fluids. Discussed with POA, niece at bedside.  Transition to comfort measures.  Started morphine and Ativan as needed.  Stop IV antibiotics.  Stop IV fluids.  All the records are reviewed and case discussed with Care Management/Social Worker Management plans discussed with the patient, family and they are in agreement.  CODE STATUS: DNR  DVT Prophylaxis: SCDs  TOTAL TIME TAKING CARE OF THIS PATIENT: 30 minutes.   POSSIBLE D/C IN 1-2 DAYS, DEPENDING ON CLINICAL CONDITION.  Orie Fisherman M.D on 12/21/2017 at 11:25 AM  Between 7am to 6pm - Pager - [phone]  After 6pm go to  [email or web address] - password [email or web address] [facility]  [facility]  Office  [phone]  CC: Primary care physician; Jaclyn Shaggy, MD  Note: This dictation was prepared with Dragon dictation along with smaller phrase technology. Any transcriptional errors that result from this process are unintentional.

## 2017-12-21 NOTE — Clinical Social Work Note (Addendum)
CSW received verbal consult for hospice home referral. The CSW has sent the referral to the [facility]. The admissions coordinator is aware of the referral and will update the CSW as to acceptance once the referral is evaluated.   UPDATE: The patient has been accepted to the [facility]. The CSW has sent the discharge summary and waiting for the RN admissions coordinator to give report and transport time. CSW is following.  Jeanette PonderKaren Miles [name], MSW, Theresia MajorsLCSWA [phone]

## 2017-12-21 NOTE — Progress Notes (Signed)
Telephone call from the Hospice Home, spoke with D. Jeannetta EllisGuyer RN; report given to RN [facility]; no further questions voiced at this time

## 2017-12-21 NOTE — Progress Notes (Signed)
Advance care planning  Patient is nonverbal and unable to make her own decisions.  Admitted for subdural hematoma, acute encephalopathy and UTI.  Met with her healthcare power of attorney, niece at bedside.  We discussed regarding patient's chronic medical problems and reason for medical admission.  Patient is nonverbal, tachypneic and gasping for breath.  This time seems uncomfortable.  She has not improved since admission.  Niece mentions that patient has done well until recently when she was admitted to assisted living facility.  Has had recurrent falls.  Increasing confusion.  She still waiting to hear from the assisted living place on how patient fell and she is extremely concerned about the care she received there.  Niece understands that patient is nearing end of life at this point.  With subdural hematomas and no improvement in the last 24 hours recovery is difficult.  We discussed regarding transitioning to comfort measures.  Niece agrees.  Orders entered for morphine and Ativan as needed.  Will check with social work to transfer to hospice home.    Life expectancy less than 1 week.  Time spent 20 minutes

## 2017-12-21 NOTE — Progress Notes (Signed)
[facility]; Care Management previously prepared discharge packet for EMS personnel to take to the facility; Carlis StableKare, MSW verbally advised staff to arrange for transportation at 1630 to the Hospice Home Room 106 by EMS; 911 called and arranged for nonemergency transportation at 1630 or first available after; pt's discharge pending arrival of EMS

## 2017-12-21 NOTE — Progress Notes (Signed)
[facility] [phone], spoke to D. Jeannetta EllisGuyer RN; no questions voiced at this time; EMS present for pt discharge; discharge packet given to EMS personnel; pt discharged via stretcher to the Hospice Home

## 2018-01-05 DEATH — deceased

## 2018-01-12 ENCOUNTER — Ambulatory Visit: Payer: Medicare Other | Admitting: Podiatry

## 2018-05-04 IMAGING — CT CT HEAD W/O CM
3 of 7 series · 16 of 47 positions shown, 19 images · non-contrast
Comparison: CT brain scan of 02/11/2017

CLINICAL DATA: Altered level of consciousness, confusion

EXAM:
CT HEAD WITHOUT CONTRAST
TECHNIQUE: Contiguous axial images were obtained from the base of the skull
through the vertex without intravenous contrast.

[Series 3: head wo · axial · 0.41mm/px · z∈[-148,-18]mm · 11 of 32 slices shown, 14 images]
[im 3/32  brain]
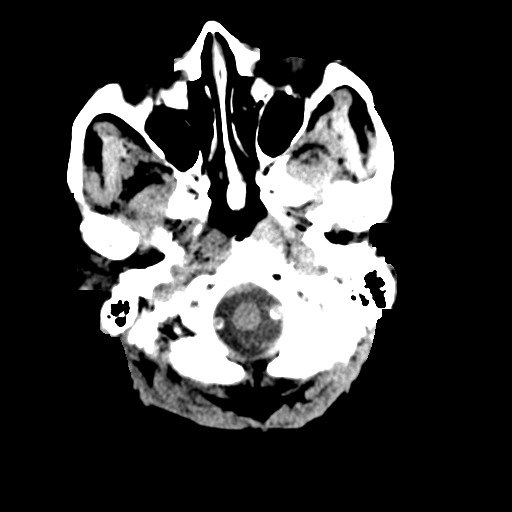
[im 3/32  bone]
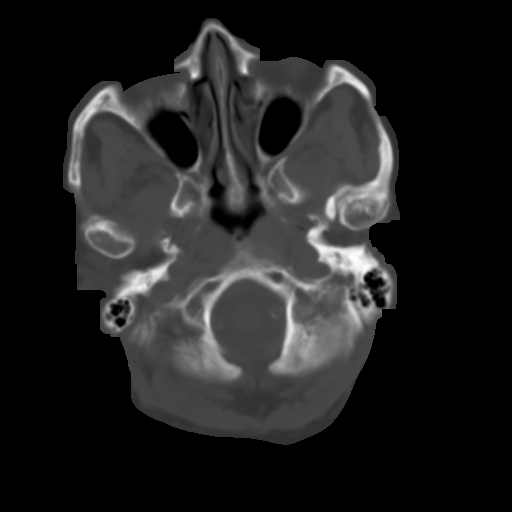
[im 6/32  brain]
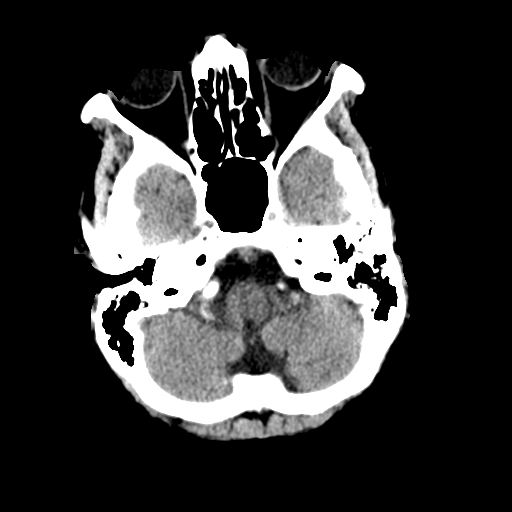
[im 8/32  brain]
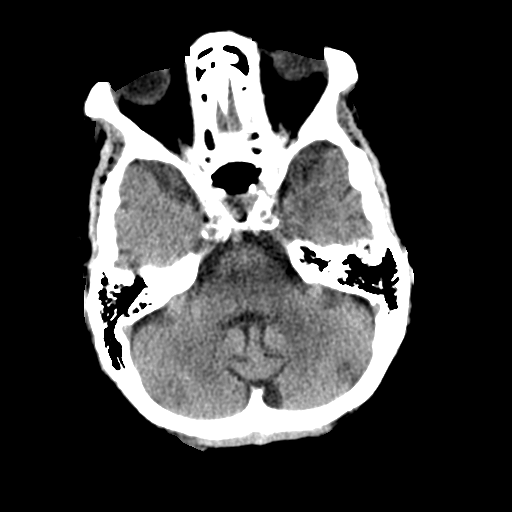
[im 11/32  brain]
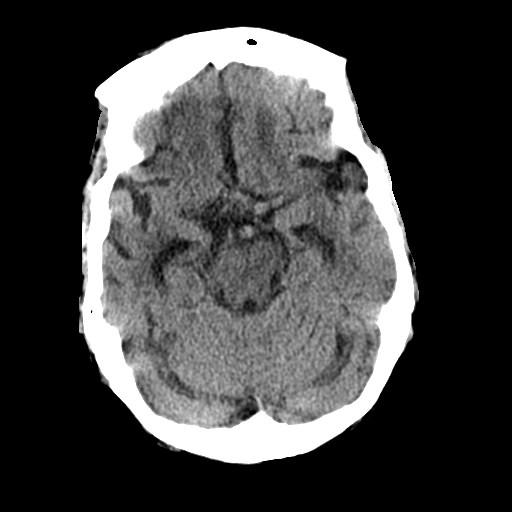
[im 13/32  brain]
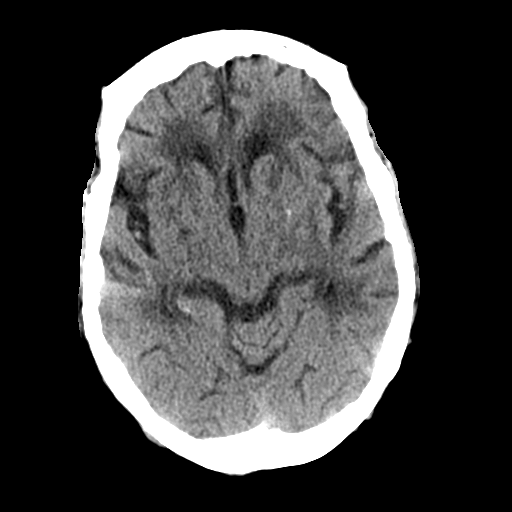
[im 13/32  bone]
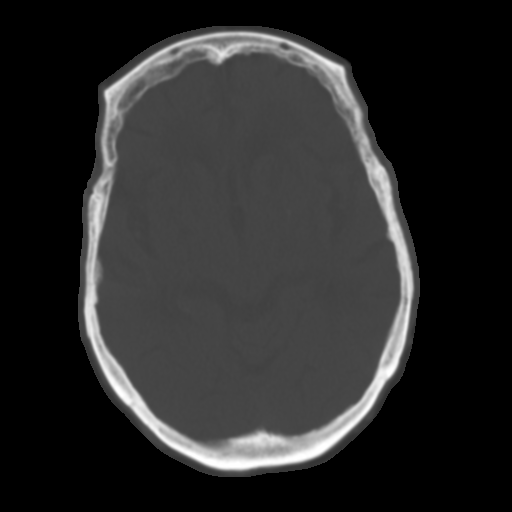
[im 16/32  brain]
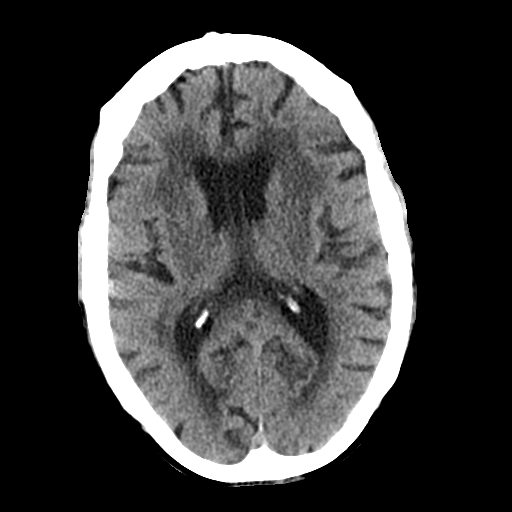
[im 19/32  brain]
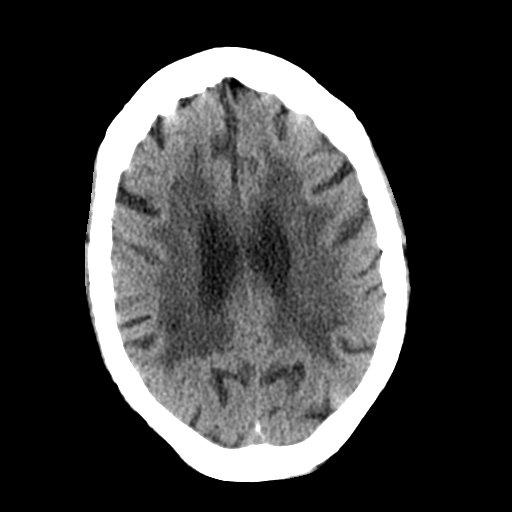
[im 21/32  brain]
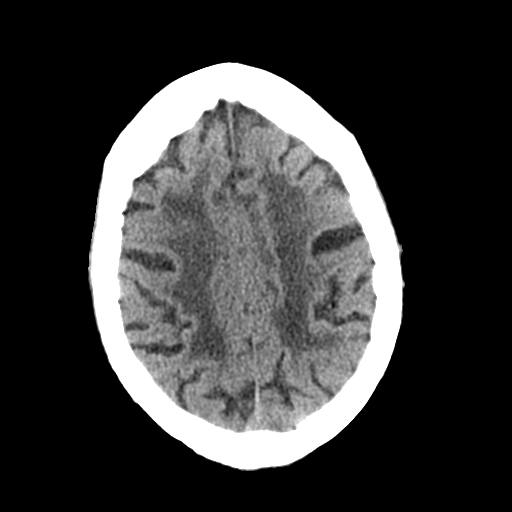
[im 24/32  brain]
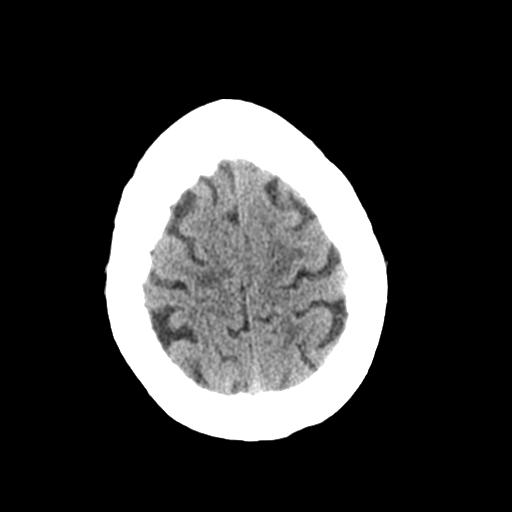
[im 24/32  bone]
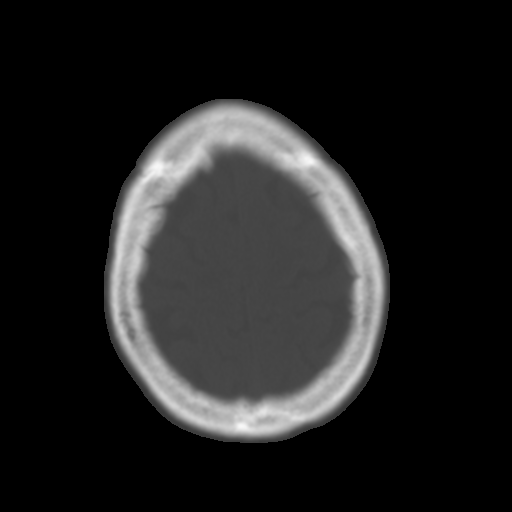
[im 26/32  brain]
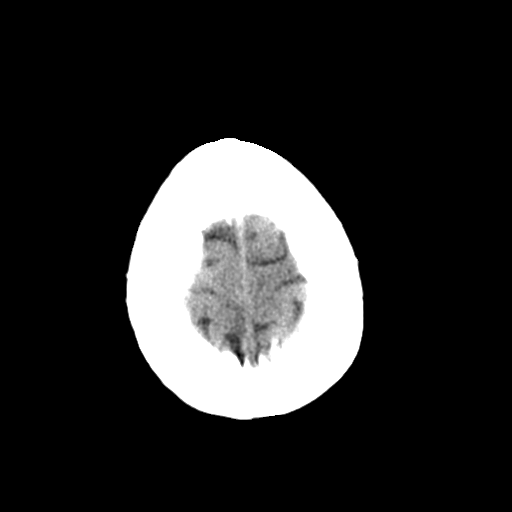
[im 29/32  brain]
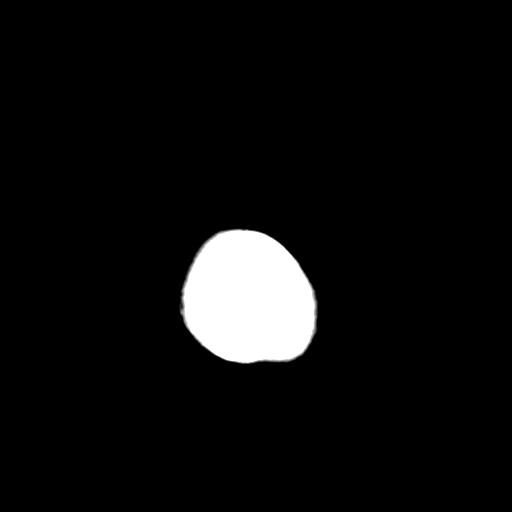

[Series 4: coronal soft tissue · coronal · 0.29mm/px · 3 of 56 slices shown]
[im 14/56  brain]
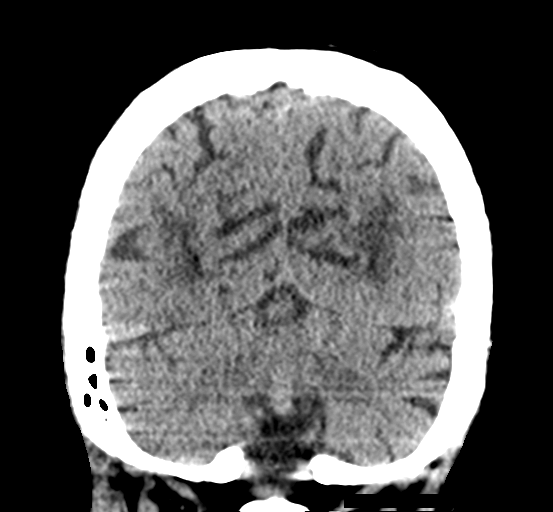
[im 28/56  brain]
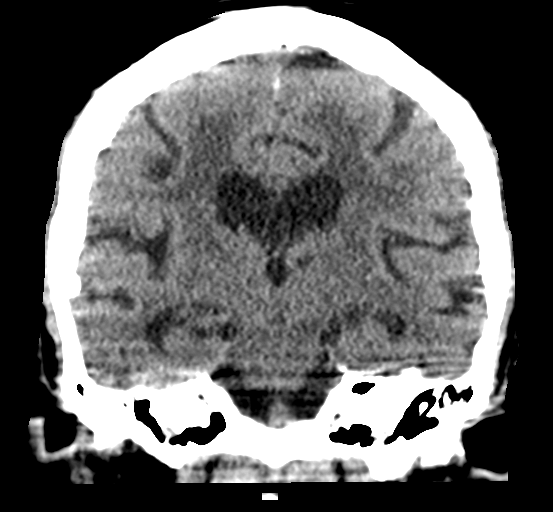
[im 42/56  brain]
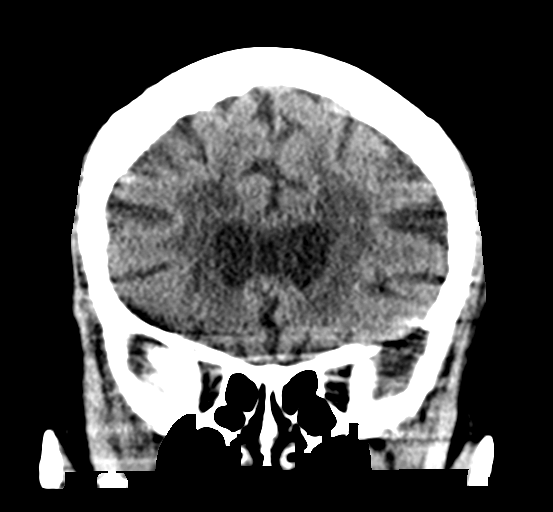

[Series 5: sagittal soft tissue · sagittal · 0.30mm/px · 2 of 44 slices shown]
[im 15/44  brain]
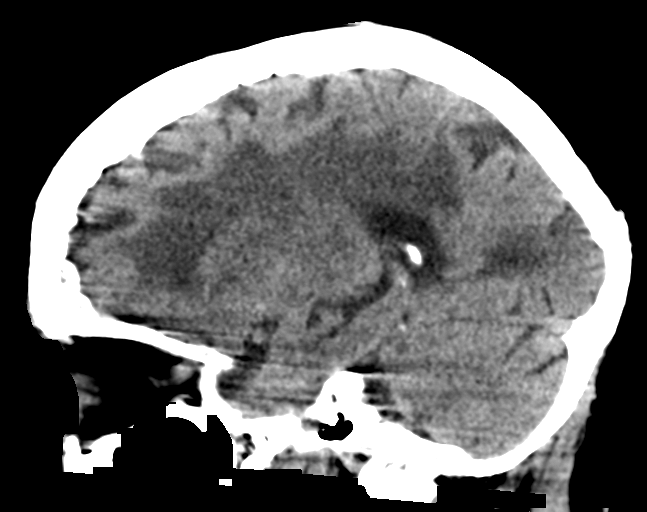
[im 29/44  brain]
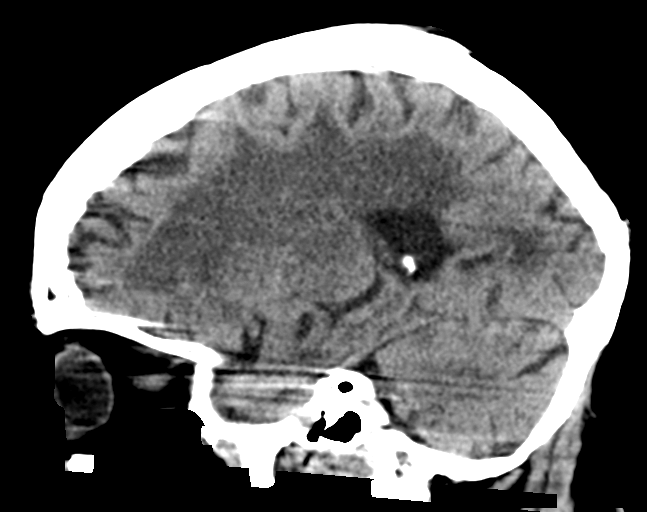

[16 of 47 positions shown; findings below may reference images not displayed]

FINDINGS: Brain: The ventricular system remains prominent as do the cortical
sulci, consistent with diffuse atrophy. Cavum septum pellucidum, a
normal variation, is stable. Moderately severe small vessel ischemic
change is again noted throughout the periventricular white matter
which may have progressed somewhat in the interval. However, no
acute hemorrhage, mass lesion, or infarction is seen.

Vascular: No vascular abnormality is noted on this unenhanced study.

Skull: On bone window images, no calvarial abnormality is seen.

Sinuses/Orbits: The paranasal sinuses are well pneumatized. No
air-fluid level is seen.

Other: None.
IMPRESSION: 1. No acute intracranial abnormality.
2. Little change to slight progression of moderately severe small
vessel ischemic change throughout the periventricular white matter.
